# Patient Record
Sex: Female | Born: 1960 | Race: White | Hispanic: No | Marital: Married | Smoking: Former smoker
Health system: Southern US, Community
[De-identification: ages and names within clinical notes are randomized; demographics above are authoritative.]

## PROBLEM LIST (undated history)

## (undated) DIAGNOSIS — F32A Depression, unspecified: Secondary | ICD-10-CM

## (undated) DIAGNOSIS — G4700 Insomnia, unspecified: Secondary | ICD-10-CM

## (undated) DIAGNOSIS — K219 Gastro-esophageal reflux disease without esophagitis: Secondary | ICD-10-CM

## (undated) DIAGNOSIS — E079 Disorder of thyroid, unspecified: Secondary | ICD-10-CM

## (undated) DIAGNOSIS — E785 Hyperlipidemia, unspecified: Secondary | ICD-10-CM

## (undated) DIAGNOSIS — F329 Major depressive disorder, single episode, unspecified: Secondary | ICD-10-CM

## (undated) HISTORY — DX: Insomnia, unspecified: G47.00

## (undated) HISTORY — DX: Hyperlipidemia, unspecified: E78.5

## (undated) HISTORY — PX: THYROIDECTOMY, PARTIAL: SHX18

## (undated) HISTORY — DX: Gastro-esophageal reflux disease without esophagitis: K21.9

## (undated) HISTORY — PX: TUBAL LIGATION: SHX77

## (undated) HISTORY — PX: CARPAL TUNNEL RELEASE: SHX101

## (undated) HISTORY — PX: BLADDER REPAIR: SHX76

## (undated) HISTORY — PX: BREAST ENHANCEMENT SURGERY: SHX7

---

## 2002-08-27 ENCOUNTER — Encounter: Payer: Self-pay | Admitting: Internal Medicine

## 2002-08-27 ENCOUNTER — Ambulatory Visit (HOSPITAL_COMMUNITY): Admission: RE | Admit: 2002-08-27 | Discharge: 2002-08-27 | Payer: Self-pay | Admitting: Internal Medicine

## 2007-09-18 ENCOUNTER — Emergency Department (HOSPITAL_COMMUNITY): Admission: EM | Admit: 2007-09-18 | Discharge: 2007-09-18 | Payer: Self-pay | Admitting: Emergency Medicine

## 2007-10-01 ENCOUNTER — Ambulatory Visit (HOSPITAL_COMMUNITY): Admission: RE | Admit: 2007-10-01 | Discharge: 2007-10-01 | Payer: Self-pay | Admitting: Internal Medicine

## 2009-03-16 ENCOUNTER — Ambulatory Visit (HOSPITAL_COMMUNITY): Admission: RE | Admit: 2009-03-16 | Discharge: 2009-03-16 | Payer: Self-pay | Admitting: Internal Medicine

## 2010-07-18 ENCOUNTER — Encounter: Payer: Self-pay | Admitting: Internal Medicine

## 2010-07-21 ENCOUNTER — Encounter
Admission: RE | Admit: 2010-07-21 | Discharge: 2010-07-21 | Payer: Self-pay | Source: Home / Self Care | Attending: Obstetrics and Gynecology | Admitting: Obstetrics and Gynecology

## 2010-07-28 ENCOUNTER — Encounter: Payer: Self-pay | Admitting: Obstetrics and Gynecology

## 2010-10-12 ENCOUNTER — Ambulatory Visit (HOSPITAL_BASED_OUTPATIENT_CLINIC_OR_DEPARTMENT_OTHER)
Admission: RE | Admit: 2010-10-12 | Discharge: 2010-10-12 | Disposition: A | Discharge status: Home / Self Care | Payer: BC Managed Care – HMO | Source: Ambulatory Visit | Attending: Urology | Admitting: Urology

## 2010-10-12 DIAGNOSIS — Z01812 Encounter for preprocedural laboratory examination: Secondary | ICD-10-CM | POA: Insufficient documentation

## 2010-10-12 DIAGNOSIS — Z79899 Other long term (current) drug therapy: Secondary | ICD-10-CM | POA: Insufficient documentation

## 2010-10-12 DIAGNOSIS — N8111 Cystocele, midline: Secondary | ICD-10-CM | POA: Insufficient documentation

## 2010-10-12 DIAGNOSIS — N393 Stress incontinence (female) (male): Secondary | ICD-10-CM | POA: Insufficient documentation

## 2010-10-12 DIAGNOSIS — Z01810 Encounter for preprocedural cardiovascular examination: Secondary | ICD-10-CM | POA: Insufficient documentation

## 2010-10-12 LAB — TYPE AND SCREEN

## 2010-11-08 NOTE — Op Note (Signed)
  NAME:  Lindsay Howe, Lindsay Howe               ACCOUNT NO.:  [id number]  MEDICAL RECORD NO.:  [id number]           PATIENT TYPE:  LOCATION:                                 FACILITY:  PHYSICIAN:  Martina Sinner, MD      DATE OF BIRTH:  DATE OF PROCEDURE: DATE OF DISCHARGE:                              OPERATIVE REPORT   SURGEON:  Martina Sinner, MD  PREOPERATIVE DIAGNOSIS:  Stress incontinence.  POSTOPERATIVE DIAGNOSIS:  Stress incontinence.  SURGERY:  Sling, cystourethropexy [facility]) plus cystoscopy.  HISTORY:  The patient has stress incontinence.  Preoperative laboratory tests were normal.  Preoperative antibiotics were given.  Extra care was taken with leg positioning to minimize the risk of neuropathy and DVT.  Curved cerebellar and weighted vaginal speculum was utilized for exposure.  Urethral catheter was inserted.  Two 1 cm incisions were made 1 fingerbreadth above the symphysis pubis, 1.5 cm lateral to the midline. She had a fairly long urethra.  She had a grade 1 cystocele.  I instilled approximately 5 cc of lidocaine and epinephrine mixture suburethrally.  I made an appropriate deep incision in the midurethra and sharply dissected through the urethrovesical angle bilaterally.  With the bladder emptied, I passed SPARC needle on top along the back of symphysis pubis __________ bilaterally, staying lateral and turning medially.  I then cystoscoped the patient.  There is no injury to bladder or urethra.  There is no movement of bladder with moving out of the trocars.  There was excellent __________ bilaterally.  The bladder was emptied.  I tested SPARC sling and brought up to the retropubic space.  I tensioned over the fat part of moderate sized Kelly clamp.  I tensioned over the fat with moderate sized Kelly clamp.  I had a couple of the blue dots, irrigated the sheath and then removed the sheath.  I was very happy with the tension and position of the sling. There is  appropriate hypermobility with no sling back effect.  The sling was in the midurethra, and based upon her anatomy, I was a little bit in the proximal urethra.  The sling was away from any cystocele.  All incisions were irrigated.  I closed the anterior vaginal wall with running 2-0 followed by 2 interrupted sutures.  I cut the sling, brought all the skin, and closed the abdominal incisions with 4-0 Vicryl followed by Dermabond.  Foley catheter was clamped.  Vaginal pack was inserted.  Leg position was good.  Foley had clear blue urine at the end of the case. Hopefully, this operation will reach Ms. Gretzinger reach goal.          ______________________________ Martina Sinner, MD     SAM/MEDQ  D:  10/12/2010  T:  10/12/2010  Job:  [id number]  Electronically Signed by Alfredo Martinez MD on 11/08/2010 01:15:56 PM

## 2011-03-21 LAB — BASIC METABOLIC PANEL
Calcium: 9.1
GFR calc Af Amer: >60
GFR calc non Af Amer: >60
Glucose, Bld: 107 — ABNORMAL HIGH
Sodium: 133 — ABNORMAL LOW

## 2011-03-21 LAB — URINALYSIS, ROUTINE W REFLEX MICROSCOPIC
Glucose, UA: NEGATIVE
Hgb urine dipstick: NEGATIVE
Specific Gravity, Urine: 1.025
pH: 6

## 2011-03-21 LAB — DIFFERENTIAL
Basophils Absolute: 0
Lymphocytes Relative: 11 — ABNORMAL LOW
Monocytes Absolute: 0.5
Neutro Abs: 5.2

## 2011-03-21 LAB — CBC
Hemoglobin: 15
RDW: 13.2

## 2011-10-28 ENCOUNTER — Ambulatory Visit (HOSPITAL_COMMUNITY)
Admission: RE | Admit: 2011-10-28 | Discharge: 2011-10-28 | Disposition: A | Discharge status: Home / Self Care | Payer: Managed Care, Other (non HMO) | Source: Ambulatory Visit | Attending: Orthopedic Surgery | Admitting: Orthopedic Surgery

## 2011-10-28 DIAGNOSIS — M25559 Pain in unspecified hip: Secondary | ICD-10-CM | POA: Insufficient documentation

## 2011-10-28 DIAGNOSIS — M6281 Muscle weakness (generalized): Secondary | ICD-10-CM | POA: Insufficient documentation

## 2011-10-28 DIAGNOSIS — IMO0001 Reserved for inherently not codable concepts without codable children: Secondary | ICD-10-CM | POA: Insufficient documentation

## 2011-10-28 NOTE — Evaluation (Signed)
Physical Therapy Evaluation  Patient Details  Name: Lindsay Howe MRN: [id number] Date of Birth: 22-Oct-1960  Today's Date: 10/28/2011 Time: 0900-0947 PT Time Calculation (min): 47 min  Visit#: 1  of 6   Re-eval: 11/11/11 Assessment Diagnosis: B Greater trochanteric bursitis Next MD Visit:  (12/13/2011) Prior Therapy: none   Past Medical History: No past medical history on file. Past Surgical History: No past surgical history on file.  Subjective Symptoms/Limitations Symptoms: Ms. Lindsay Howe has had hip pain B whenever she is standing for prolong period of time.  She is able to be on her feet for about two hours before she has pain in her hip that runs down the outer thigh to the knee level.  Occasionally it will go down to her ankles.  She has had B injections about two weeks ago that still seem to be helping.  She was taking ibuprofen but was taking so much that her stomach was bothering her.  She states her pain is mainly at night.   How long can you sit comfortably?: No problem How long can you stand comfortably?: Standing is worse than walking.  She is able to stand for thirty minutes and she can start feeling increased discomfort.  She will need to stop standing after two hours. How long can you walk comfortably?: The patient states she can be on her feet for two hours before she has increased pain. Special Tests: The patient states that evenings are the worst.  She takes medicine to get to sleep.  Prior to the injection she was waking up up to 8 hours a night. Pain Assessment Currently in Pain?: Yes Pain Score:   4 (prior to injection pain was as high as an 8/10) Pain Location: Hip Pain Orientation: Right;Left Pain Type: Chronic pain Pain Onset: More than a month ago Pain Frequency: Constant Pain Relieving Factors: injection, ibuprofen Effect of Pain on Daily Activities: increases  Cognition Overall Cognitive Status: Appears within functional limits for tasks assessed     Assessment RLE Strength Right Hip Flexion: 5/5 Right Hip Extension: 5/5 Right Hip ABduction: 5/5 Right Hip ADduction: 5/5 Right Knee Flexion: 5/5 Right Knee Extension: 5/5 Right Ankle Dorsiflexion: 5/5 LLE Strength Left Hip Flexion: 5/5 Left Hip Extension: 5/5 Left Hip ABduction: 5/5 Left Hip ADduction: 5/5 Left Knee Flexion: 5/5 Left Knee Extension: 5/5 Left Ankle Dorsiflexion: 5/5  Exercise/Treatments  Modalities Modalities: Iontophoresis Iontophoresis Type of Iontophoresis: Dexamethasone Location: B greater trochanter Dose: 1.5 cc @ 60 ma Time: 15.00  Physical Therapy Assessment and Plan PT Assessment and Plan Clinical Impression Statement: Pt with B greater trochaneric bursitis who will benefit from iontophoresis to decrease her sx of pain and improve her functional ability. Pt will benefit from skilled therapeutic intervention in order to improve on the following deficits: Pain Rehab Potential: Good PT Frequency: Min 3X/week PT Duration:  (2 weeks.) PT Treatment/Interventions:  (iontophoresis; manual therapy(myofascial release),) PT Plan: See pt three times a week for two weeks for modalities including but not limited to iontophoresis and myofascial stretching of B hiip.    Goals Home Exercise Program Pt will Perform Home Exercise Program: Independently PT Short Term Goals Time to Complete Short Term Goals: 2 weeks PT Short Term Goal 1: Pain decreased by 50 %  PT Short Term Goal 3: Pt to be able to be on her feet for four hours without having increased pain  Problem List There is no problem list on file for this patient.   PT - End  of Session Activity Tolerance: Patient tolerated treatment well General Behavior During Session: [facility] for tasks performed Cognition: [facility] for tasks performed PT Plan of Care PT Home Exercise Plan: ITBand stretch Consulted and Agree with Plan of Care: Patient    Lindsay Howe,Lindsay Howe 10/28/2011, 5:24 PM  Physician  Documentation Your signature is required to indicate approval of the treatment plan as stated above.  Please sign and either send electronically or make a copy of this report for your files and return this physician signed original.   Please mark one 1.__approve of plan  2. ___approve of plan with the following conditions.   ______________________________                                                          _____________________ Physician Signature                                                                                                             Date

## 2011-11-01 ENCOUNTER — Ambulatory Visit (HOSPITAL_COMMUNITY)
Admission: RE | Admit: 2011-11-01 | Discharge: 2011-11-01 | Disposition: A | Discharge status: Home / Self Care | Payer: Managed Care, Other (non HMO) | Source: Ambulatory Visit | Attending: *Deleted | Admitting: *Deleted

## 2011-11-01 NOTE — Progress Notes (Signed)
Physical Therapy Treatment Patient Details  Name: Lindsay Howe MRN: [id number] Date of Birth: Dec 01, 1960  Today's Date: 11/01/2011 Time: 4098-1191 PT Time Calculation (min): 49 min Visit#: 2  of 6   Re-eval: 11/11/11 Charges: Sherrin Daisy x 15' Manual x 20'   Subjective: Symptoms/Limitations Symptoms: Pt is currently pain free. Pain Assessment Currently in Pain?: No/denies Pain Score: 0-No pain   Exercise/Treatments Stretches IT Band Stretch: 1 rep;30 seconds (B standing reviewed for proper technique)  Modalities Modalities: Iontophoresis Manual Therapy Manual Therapy: Myofascial release Myofascial Release: To B hip and ITB x 20' Iontophoresis Type of Iontophoresis: Dexamethasone Location: B greater trochanter Dose: 2 cc @ 60 ma Time: 15'  Physical Therapy Assessment and Plan PT Assessment and Plan Clinical Impression Statement: ITB tightness noted with MFR. Iontophoresis completed again this session to decrease pain. Pt reports decreased tightness at end of session. ITB stretch reviewed for HEP.  PT Plan: Continue per PT POC.     Problem List There is no problem list on file for this patient.   PT - End of Session Activity Tolerance: Patient tolerated treatment well General Behavior During Session: [facility] for tasks performed Cognition: [facility] for tasks performed  Seth Bake, PTA 11/01/2011, 1:58 PM

## 2011-11-03 ENCOUNTER — Ambulatory Visit (HOSPITAL_COMMUNITY)
Admission: RE | Admit: 2011-11-03 | Discharge: 2011-11-03 | Disposition: A | Discharge status: Home / Self Care | Payer: Managed Care, Other (non HMO) | Source: Ambulatory Visit | Attending: Orthopedic Surgery | Admitting: Orthopedic Surgery

## 2011-11-03 NOTE — Progress Notes (Signed)
Physical Therapy Treatment Patient Details  Name: Lindsay Howe MRN: [id number] Date of Birth: 1960/10/02  Today's Date: 11/03/2011 Time: 1478-2956 PT Time Calculation (min): 61 min Visit#: 3  of 6   Re-eval: 11/11/11 Charges: Manual x 20 Ionto x 22'  Symptoms/Limitations Symptoms: I think the massage really helped. Pain Assessment Currently in Pain?: No/denies Pain Score: 0-No pain   Exercise/Treatments Modalities Modalities: Iontophoresis Manual Therapy Manual Therapy: Myofascial release Myofascial Release: To B hip and ITB x 20' Iontophoresis Type of Iontophoresis: Dexamethasone Location: B greater trochanter Dose: 2 cc @ 60 ma Time: 22' (Pt unable to tolerate greater than 2.5 current this session)  Physical Therapy Assessment and Plan PT Assessment and Plan Clinical Impression Statement: Pt presents with decreased sensitivity to manual therapy this session. Pt also presents with decreased spasms and adhesions. Pt states "They feel better" at end of session. PT Plan: Continue per PT POC. Begin figure 4 piriformis stretch next session.     Problem List There is no problem list on file for this patient.   PT - End of Session Activity Tolerance: Patient tolerated treatment well General Behavior During Session: [facility] for tasks performed Cognition: [facility] for tasks performed   Seth Bake, PTA 11/03/2011, 2:31 PM

## 2011-11-09 ENCOUNTER — Ambulatory Visit (HOSPITAL_COMMUNITY)
Admission: RE | Admit: 2011-11-09 | Discharge: 2011-11-09 | Disposition: A | Discharge status: Home / Self Care | Payer: Managed Care, Other (non HMO) | Source: Ambulatory Visit | Attending: Orthopedic Surgery | Admitting: Orthopedic Surgery

## 2011-11-09 NOTE — Progress Notes (Signed)
Physical Therapy Treatment Patient Details  Name: Lindsay Howe MRN: [id number] Date of Birth: 04-23-1961  Today's Date: 11/09/2011 Time:  1345- 1438   Visit#: 4  of 6   Re-eval: 11/14/11   Subjective: Symptoms/Limitations Symptoms: Pt states she had no more R hip pain after her last visit but she is having L hip pain now.l Pain Assessment Pain Score:   4 Pain Location: Hip Pain Orientation: Left Pain Type: Chronic pain Pain Onset: More than a month ago Pain Frequency: Constant    Exercise/Treatments Pt doing stretches at home  Modalities Modalities: Iontophoresis Manual Therapy Manual Therapy: Myofascial release Iontophoresis Type of Iontophoresis: Dexamethasone Location: B greater trochaner  Dose: 1.5 cc @ 60 ma current at 2.8 used gel under ground pad.  Physical Therapy Assessment and Plan    Improvement with R hip.  Will continue to give 6 ionto treatments then assess pain   Goals    Problem List There is no problem list on file for this patient.    GP No functional reporting required  Lindsay Howe,Lindsay Howe 11/09/2011, 4:40 PM

## 2011-11-14 ENCOUNTER — Ambulatory Visit (HOSPITAL_COMMUNITY): Payer: Managed Care, Other (non HMO) | Admitting: Physical Therapy

## 2011-11-16 ENCOUNTER — Ambulatory Visit (HOSPITAL_COMMUNITY)
Admission: RE | Admit: 2011-11-16 | Discharge: 2011-11-16 | Disposition: A | Discharge status: Home / Self Care | Payer: Managed Care, Other (non HMO) | Source: Ambulatory Visit | Attending: Orthopedic Surgery | Admitting: Orthopedic Surgery

## 2011-11-16 NOTE — Progress Notes (Signed)
Physical Therapy Treatment Patient Details  Name: Lindsay Howe MRN: [id number] Date of Birth: Mar 22, 1961  Today's Date: 11/16/2011 Time: 0940-1005 PT Time Calculation (min): 25 min Visit#: 5  of 6   Re-eval:  (Next visit) Charges: Ionto x 15'   Subjective: Symptoms/Limitations Symptoms: Pt is pain free. Pain Assessment Currently in Pain?: No/denies   Exercise/Treatments  Iontophoresis Location: B greater trochaner Dose: 2 cc@ 60ma  Time: 15'  Physical Therapy Assessment and Plan PT Assessment and Plan Clinical Impression Statement: Mnual therapy held this session as pt is pain free. Fith ionto completed this session. Pt is without complaint throughout session.  PT Plan: Reassess next session.     Problem List There is no problem list on file for this patient.   PT - End of Session Activity Tolerance: Patient tolerated treatment well General Behavior During Session: [facility] for tasks performed Cognition: [facility] for tasks performed  Seth Bake, PTA 11/16/2011, 10:15 AM

## 2011-11-17 ENCOUNTER — Ambulatory Visit (HOSPITAL_COMMUNITY)
Admission: RE | Admit: 2011-11-17 | Discharge: 2011-11-17 | Disposition: A | Discharge status: Home / Self Care | Payer: Managed Care, Other (non HMO) | Source: Ambulatory Visit | Attending: Orthopedic Surgery | Admitting: Orthopedic Surgery

## 2011-11-17 NOTE — Progress Notes (Signed)
Physical Therapy Treatment Patient Details  Name: Lindsay Howe MRN: [id number] Date of Birth: February 20, 1961  Today's Date: 11/17/2011 Time: 4098-1191 PT Time Calculation (min): 34 min Visit#: 6  of 6   Charges: Ionto x 15'  Subjective: Symptoms/Limitations Symptoms: I'm not having any pain. I think I'm ready to be finished with therapy. It has really helped. Pain Assessment Currently in Pain?: No/denies Pain Score: 0-No pain  LEFS 74.5/80 (60/80 at eval)  Iontophoresis Type of Iontophoresis: Dexamethasone Location: B greater trochaner Dose: 2 cc@ 60ma Time: 15'  Physical Therapy Assessment and Plan PT Assessment and Plan Clinical Impression Statement: Pt is pain free and without complaint. All goals have been met. Pt's LEFS score has increased 14.5 points from initial eval. PT Plan: Recommend D/C to HEP to PT.    Goals PT Short Term Goals PT Short Term Goal 1: Pain decreased by 50 % PT Short Term Goal 1 - Progress: Met PT Short Term Goal 3: Pt to be able to be on her feet for four hours without having increased pain PT Short Term Goal 3 - Progress: Met  Problem List There is no problem list on file for this patient.   PT - End of Session Activity Tolerance: Patient tolerated treatment well General Behavior During Session: [facility] for tasks performed Cognition: [facility] for tasks performed   Seth Bake, PTA 11/17/2011, 1:42 PM

## 2011-11-18 ENCOUNTER — Inpatient Hospital Stay (HOSPITAL_COMMUNITY)
Admission: RE | Admit: 2011-11-18 | Payer: Managed Care, Other (non HMO) | Source: Ambulatory Visit | Admitting: Physical Therapy

## 2011-11-22 ENCOUNTER — Ambulatory Visit (HOSPITAL_COMMUNITY): Payer: Managed Care, Other (non HMO) | Admitting: *Deleted

## 2011-11-24 ENCOUNTER — Ambulatory Visit (HOSPITAL_COMMUNITY): Payer: Managed Care, Other (non HMO) | Admitting: Physical Therapy

## 2011-11-25 ENCOUNTER — Ambulatory Visit (HOSPITAL_COMMUNITY): Payer: Managed Care, Other (non HMO)

## 2011-11-28 ENCOUNTER — Ambulatory Visit (HOSPITAL_COMMUNITY): Payer: Managed Care, Other (non HMO) | Admitting: Physical Therapy

## 2011-11-30 ENCOUNTER — Ambulatory Visit (HOSPITAL_COMMUNITY): Payer: Managed Care, Other (non HMO) | Admitting: *Deleted

## 2011-12-02 ENCOUNTER — Ambulatory Visit (HOSPITAL_COMMUNITY): Payer: Managed Care, Other (non HMO)

## 2011-12-05 ENCOUNTER — Ambulatory Visit (HOSPITAL_COMMUNITY): Payer: Managed Care, Other (non HMO)

## 2011-12-07 ENCOUNTER — Ambulatory Visit (HOSPITAL_COMMUNITY): Payer: Managed Care, Other (non HMO) | Admitting: *Deleted

## 2011-12-09 ENCOUNTER — Ambulatory Visit (HOSPITAL_COMMUNITY): Payer: Managed Care, Other (non HMO) | Admitting: *Deleted

## 2011-12-12 ENCOUNTER — Ambulatory Visit (HOSPITAL_COMMUNITY): Payer: Managed Care, Other (non HMO)

## 2011-12-14 ENCOUNTER — Ambulatory Visit (HOSPITAL_COMMUNITY): Payer: Managed Care, Other (non HMO) | Admitting: Physical Therapy

## 2011-12-16 ENCOUNTER — Ambulatory Visit (HOSPITAL_COMMUNITY): Payer: Managed Care, Other (non HMO) | Admitting: Physical Therapy

## 2011-12-19 ENCOUNTER — Ambulatory Visit (HOSPITAL_COMMUNITY): Payer: Managed Care, Other (non HMO) | Admitting: Physical Therapy

## 2011-12-21 ENCOUNTER — Ambulatory Visit (HOSPITAL_COMMUNITY): Payer: Managed Care, Other (non HMO) | Admitting: *Deleted

## 2011-12-23 ENCOUNTER — Ambulatory Visit (HOSPITAL_COMMUNITY): Payer: Managed Care, Other (non HMO) | Admitting: Physical Therapy

## 2012-04-14 ENCOUNTER — Emergency Department (HOSPITAL_COMMUNITY)
Admission: EM | Admit: 2012-04-14 | Discharge: 2012-04-14 | Disposition: A | Discharge status: Home / Self Care | Payer: Managed Care, Other (non HMO) | Attending: Emergency Medicine | Admitting: Emergency Medicine

## 2012-04-14 ENCOUNTER — Encounter (HOSPITAL_COMMUNITY): Payer: Self-pay | Admitting: Emergency Medicine

## 2012-04-14 ENCOUNTER — Emergency Department (HOSPITAL_COMMUNITY): Payer: Managed Care, Other (non HMO)

## 2012-04-14 DIAGNOSIS — F3289 Other specified depressive episodes: Secondary | ICD-10-CM | POA: Insufficient documentation

## 2012-04-14 DIAGNOSIS — R0789 Other chest pain: Secondary | ICD-10-CM

## 2012-04-14 DIAGNOSIS — F329 Major depressive disorder, single episode, unspecified: Secondary | ICD-10-CM | POA: Insufficient documentation

## 2012-04-14 DIAGNOSIS — E079 Disorder of thyroid, unspecified: Secondary | ICD-10-CM | POA: Insufficient documentation

## 2012-04-14 HISTORY — DX: Depression, unspecified: F32.A

## 2012-04-14 HISTORY — DX: Disorder of thyroid, unspecified: E07.9

## 2012-04-14 HISTORY — DX: Major depressive disorder, single episode, unspecified: F32.9

## 2012-04-14 LAB — COMPREHENSIVE METABOLIC PANEL
ALT: 25 U/L (ref 0–35)
AST: 19 U/L (ref 0–37)
Albumin: 3.6 g/dL (ref 3.5–5.2)
Alkaline Phosphatase: 95 U/L (ref 39–117)
BUN: 13 mg/dL (ref 6–23)
CO2: 26 mEq/L (ref 19–32)
Calcium: 9.6 mg/dL (ref 8.4–10.5)
Chloride: 101 mEq/L (ref 96–112)
Creatinine, Ser: 0.62 mg/dL (ref 0.50–1.10)
GFR calc Af Amer: >90 mL/min (ref >90)
GFR calc non Af Amer: >90 mL/min (ref >90)
Glucose, Bld: 82 mg/dL (ref 70–99)
Potassium: 3.6 mEq/L (ref 3.5–5.1)
Sodium: 136 mEq/L (ref 135–145)
Total Bilirubin: 0.3 mg/dL (ref 0.3–1.2)
Total Protein: 7.3 g/dL (ref 6.0–8.3)

## 2012-04-14 LAB — CBC
HCT: 42.2 % (ref 36.0–46.0)
Hemoglobin: 14.7 g/dL (ref 12.0–15.0)
MCH: 32 pg (ref 26.0–34.0)
MCHC: 34.8 g/dL (ref 30.0–36.0)
MCV: 91.7 fL (ref 78.0–100.0)
Platelets: 259 10*3/uL (ref 150–400)
RBC: 4.6 MIL/uL (ref 3.87–5.11)
RDW: 13.1 % (ref 11.5–15.5)
WBC: 5.9 10*3/uL (ref 4.0–10.5)

## 2012-04-14 LAB — TROPONIN I: Troponin I: <0.3 ng/mL (ref <0.30)

## 2012-04-14 LAB — D-DIMER, QUANTITATIVE: D-Dimer, Quant: <0.27 ug/mL-FEU (ref 0.00–0.48)

## 2012-04-14 NOTE — ED Provider Notes (Signed)
History   This chart was scribed for Glynn Octave, MD by Charolett Bumpers . The patient was seen in room APA17/APA17. Patient's care was started at 1153.   CSN: [id number] Arrival date & time 04/14/12  1138  First MD Initiated Contact with Patient 04/14/12 1153      Chief Complaint  Patient presents with  . Chest Pain   The history is provided by the patient. No language interpreter was used.   Lindsay Howe is a 51 y.o. female who presents to the Emergency Department complaining of intermittent chest pain under left breast for 2 weeks lasting a few seconds at time. She states the chest pain has been constant since waking up today. She states her symptoms are not aggravated with deep breaths or palpation. She denies any modifying factors. She denies any SOB, cough, diaphoresis, n/v, abdominal pain. Denies any o skin changes or nipple discharge. She reports a h/o hypothyroid and depression. She denies any h/o HTN, DM. She states she is a former smoker of 15 years. Denies any family cardiac hx. She states she has never had a stress test.  Dr. Clelia Croft in ED  Past Medical History  Diagnosis Date  . Thyroid disease   . Depression     Past Surgical History  Procedure Date  . Thyroidectomy, partial   . Tubal ligation   . Breast enhancement surgery   . Carpal tunnel release     left    History reviewed. No pertinent family history.  History  Substance Use Topics  . Smoking status: Former Smoker -- 1.0 packs/day for 15 years    Types: Cigarettes    Quit date: 03/27/2009  . Smokeless tobacco: Never Used  . Alcohol Use: 1.8 oz/week    3 Glasses of wine per week    OB History    Grav Para Term Preterm Abortions TAB SAB Ect Mult Living   2 2 2       2       Review of Systems A complete 10 system review of systems was obtained and all systems are negative except as noted in the HPI and PMH.   Allergies  Review of patient's allergies indicates no known  allergies.  Home Medications   Current Outpatient Rx  Name Route Sig Dispense Refill  . BIOTIN PO Oral Take 1 tablet by mouth daily.    Marland Kitchen ESCITALOPRAM OXALATE 10 MG PO TABS Oral Take 10 mg by mouth daily.    Marland Kitchen LEVOTHYROXINE SODIUM 125 MCG PO TABS Oral Take 125 mcg by mouth daily.      BP 120/65  Pulse 73  Temp 97.9 F (36.6 C) (Oral)  Resp 20  Ht 5\' 2"  (1.575 m)  Wt 174 lb (78.926 kg)  BMI 31.83 kg/m2  SpO2 96%  LMP 02/01/2012  Physical Exam  Nursing note and vitals reviewed. Constitutional: She is oriented to person, place, and time. She appears well-developed and well-nourished. No distress.  HENT:  Head: Normocephalic and atraumatic.  Eyes: EOM are normal. Pupils are equal, round, and reactive to light.  Neck: Normal range of motion. Neck supple. No tracheal deviation present.  Cardiovascular: Normal rate, regular rhythm and normal heart sounds.   No murmur heard. Pulmonary/Chest: Effort normal and breath sounds normal. No respiratory distress. She has no wheezes. She exhibits no tenderness.       Examined the breast with chaperon present.   Abdominal: Soft. Bowel sounds are normal. She exhibits no distension. There is no  tenderness.  Musculoskeletal: Normal range of motion. She exhibits no edema.  Neurological: She is alert and oriented to person, place, and time.  Skin: Skin is warm and dry. No rash noted.  Psychiatric: She has a normal mood and affect. Her behavior is normal.    ED Course  Procedures (including critical care time)  DIAGNOSTIC STUDIES: Oxygen Saturation is 94% on room air, normal by my interpretation.    COORDINATION OF CARE:  12:13-Discussed planned course of treatment with the patient including a chest x-ray, blood work, who is agreeable at this time.     Labs Reviewed  CBC  COMPREHENSIVE METABOLIC PANEL  TROPONIN I  D-DIMER, QUANTITATIVE  TROPONIN I   Dg Chest 2 View  04/14/2012  *RADIOLOGY REPORT*  Clinical Data: Chest pain  CHEST  - 2 VIEW  Comparison: 03/16/2009  Findings: The heart size and mediastinal contours are within normal limits.  Both lungs are clear.  The visualized skeletal structures are unremarkable.  IMPRESSION: No active cardiopulmonary abnormalities.   Original Report Authenticated By: Rosealee Albee, M.D.      1. Atypical chest pain       MDM  Intermittent left-sided chest pain for the past 2 weeks it occurs daily lasting seconds to minutes at a time period lasting for hours today and constant. No shortness of breath, nausea, vomiting, diaphoresis or dizziness. No cardiac history. Former smoker.   EKG nonischemic.  Troponin and D-dimer negative.  Patient has had constant pain for 5 hours.  Low suspicion for ACS. Stable for stress test as outpatient.   Date: 04/14/2012  Rate: 70  Rhythm: normal sinus rhythm  QRS Axis: normal  Intervals: normal  ST/T Wave abnormalities: normal  Conduction Disutrbances:none  Narrative Interpretation:   Old EKG Reviewed: none available    I personally performed the services described in this documentation, which was scribed in my presence.  The recorded information has been reviewed and considered.      Glynn Octave, MD 04/14/12 [phone]

## 2012-04-14 NOTE — ED Notes (Signed)
Patient c/o left sided chest pain that radiates to back x2 weeks. Per patient-thought it was indigestion but it has progressively gotten worse and more frequent. Reported one episode of shortness of breath but denies any now. Denies any nausea, vomiting, weakness, or dizziness.

## 2012-06-06 ENCOUNTER — Ambulatory Visit (INDEPENDENT_AMBULATORY_CARE_PROVIDER_SITE_OTHER): Payer: Managed Care, Other (non HMO) | Admitting: Internal Medicine

## 2012-06-06 ENCOUNTER — Other Ambulatory Visit (INDEPENDENT_AMBULATORY_CARE_PROVIDER_SITE_OTHER): Payer: Self-pay | Admitting: *Deleted

## 2012-06-06 ENCOUNTER — Encounter (INDEPENDENT_AMBULATORY_CARE_PROVIDER_SITE_OTHER): Payer: Self-pay | Admitting: Internal Medicine

## 2012-06-06 ENCOUNTER — Telehealth (INDEPENDENT_AMBULATORY_CARE_PROVIDER_SITE_OTHER): Payer: Self-pay | Admitting: *Deleted

## 2012-06-06 VITALS — BP 120/70 | HR 72 | Temp 97.9°F | Ht 62.0 in | Wt 185.1 lb

## 2012-06-06 DIAGNOSIS — R1310 Dysphagia, unspecified: Secondary | ICD-10-CM

## 2012-06-06 DIAGNOSIS — Z1211 Encounter for screening for malignant neoplasm of colon: Secondary | ICD-10-CM

## 2012-06-06 DIAGNOSIS — K219 Gastro-esophageal reflux disease without esophagitis: Secondary | ICD-10-CM | POA: Insufficient documentation

## 2012-06-06 DIAGNOSIS — E039 Hypothyroidism, unspecified: Secondary | ICD-10-CM | POA: Insufficient documentation

## 2012-06-06 MED ORDER — PEG-KCL-NACL-NASULF-NA ASC-C 100 G PO SOLR: 1.0000 | Freq: Once | ORAL | Status: DC

## 2012-06-06 NOTE — Telephone Encounter (Signed)
Patient needs movi prep 

## 2012-06-06 NOTE — Patient Instructions (Addendum)
EGD/Colonoscopy

## 2012-06-06 NOTE — Progress Notes (Signed)
Subjective:     Patient ID: Lindsay Howe, female   DOB: [birth year], 51 y.o.   MRN: [id number]  HPI Referred to our office by Dr. Sherryll Burger for screening colonoscopy and an EGD. She also c/o dysphagia.  The Prilosec has helped.She was having chest pain.  No burping.  Foods felt like they were lodging. Symptoms x 4 months. Started the Prilosec one month ago and symptoms improved. No foods in particular would lodge. She denies acid reflux. She would like for Dr Karilyn Cota to do an EGD. Appetite good.  No weight loss. No acid reflux. BMs x 1 a day or every 2 days. No melena or bright red rectal bleeding.  08/25/2011 ALP 71, AST 20, ALT 18, H and H 15.0 and 43.0, MCV 89, Platelet ct 308. Review of Systems see hpi Current Outpatient Prescriptions  Medication Sig Dispense Refill  . BIOTIN PO Take 1 tablet by mouth daily.      Marland Kitchen escitalopram (LEXAPRO) 10 MG tablet Take 10 mg by mouth daily.      Marland Kitchen levothyroxine (SYNTHROID, LEVOTHROID) 125 MCG tablet Take 125 mcg by mouth daily.      . Nutritional Supplements (ESTROVEN ENERGY PO) Take by mouth.      Marland Kitchen omeprazole (PRILOSEC) 20 MG capsule Take 20 mg by mouth daily.       Past Medical History  Diagnosis Date  . Thyroid disease   . Depression    Past Surgical History  Procedure Date  . Thyroidectomy, partial   . Tubal ligation   . Breast enhancement surgery   . Carpal tunnel release     left   No Known Allergies       Objective:   Physical Exam Filed Vitals:   06/06/12 1455  BP: 120/70  Pulse: 72  Temp: 97.9 F (36.6 C)  Height: 5\' 2"  (1.575 m)  Weight: 185 lb 1.6 oz (83.961 kg)  Alert and oriented. Skin warm and dry. Oral mucosa is moist.   . Sclera anicteric, conjunctivae is pink. Thyroid not enlarged. No cervical lymphadenopathy. Lungs clear. Heart regular rate and rhythm.  Abdomen is soft. Bowel sounds are positive. No hepatomegaly. No abdominal masses felt. No tenderness.  No edema to lower extremities.         Assessment:   solid food dysphagia better with the Prilosec. Patient however would like to continue with an EGD. Screening colonoscopy.    Plan:    EGD/Colonoscopy

## 2012-06-07 ENCOUNTER — Encounter (INDEPENDENT_AMBULATORY_CARE_PROVIDER_SITE_OTHER): Payer: Self-pay

## 2012-06-08 ENCOUNTER — Encounter (HOSPITAL_COMMUNITY): Payer: Self-pay | Admitting: Pharmacy Technician

## 2012-07-05 ENCOUNTER — Encounter (HOSPITAL_COMMUNITY)
Admission: RE | Disposition: A | Discharge status: Home / Self Care | Payer: Self-pay | Source: Ambulatory Visit | Attending: Internal Medicine

## 2012-07-05 ENCOUNTER — Ambulatory Visit (HOSPITAL_COMMUNITY)
Admission: RE | Admit: 2012-07-05 | Discharge: 2012-07-05 | Disposition: A | Discharge status: Home / Self Care | Payer: Managed Care, Other (non HMO) | Source: Ambulatory Visit | Attending: Internal Medicine | Admitting: Internal Medicine

## 2012-07-05 ENCOUNTER — Encounter (HOSPITAL_COMMUNITY): Payer: Self-pay | Admitting: *Deleted

## 2012-07-05 DIAGNOSIS — K208 Other esophagitis without bleeding: Secondary | ICD-10-CM | POA: Insufficient documentation

## 2012-07-05 DIAGNOSIS — R1310 Dysphagia, unspecified: Secondary | ICD-10-CM

## 2012-07-05 DIAGNOSIS — K449 Diaphragmatic hernia without obstruction or gangrene: Secondary | ICD-10-CM

## 2012-07-05 DIAGNOSIS — Z1211 Encounter for screening for malignant neoplasm of colon: Secondary | ICD-10-CM

## 2012-07-05 DIAGNOSIS — K2990 Gastroduodenitis, unspecified, without bleeding: Secondary | ICD-10-CM

## 2012-07-05 DIAGNOSIS — K2960 Other gastritis without bleeding: Secondary | ICD-10-CM | POA: Insufficient documentation

## 2012-07-05 HISTORY — PX: COLONOSCOPY WITH ESOPHAGOGASTRODUODENOSCOPY (EGD): SHX5779

## 2012-07-05 SURGERY — COLONOSCOPY WITH ESOPHAGOGASTRODUODENOSCOPY (EGD)
Anesthesia: Moderate Sedation

## 2012-07-05 MED ORDER — MIDAZOLAM HCL 5 MG/5ML IJ SOLN
INTRAMUSCULAR | Status: AC
  Filled 2012-07-05: qty 5

## 2012-07-05 MED ORDER — MIDAZOLAM HCL 5 MG/5ML IJ SOLN
INTRAMUSCULAR | Status: DC | PRN
  Administered 2012-07-05: 2 mg via INTRAVENOUS
  Administered 2012-07-05 (×2): 1 mg via INTRAVENOUS
  Administered 2012-07-05 (×4): 2 mg via INTRAVENOUS
  Administered 2012-07-05: 1 mg via INTRAVENOUS
  Administered 2012-07-05: 2 mg via INTRAVENOUS

## 2012-07-05 MED ORDER — MIDAZOLAM HCL 5 MG/5ML IJ SOLN
INTRAMUSCULAR | Status: AC
  Filled 2012-07-05: qty 10

## 2012-07-05 MED ORDER — MEPERIDINE HCL 50 MG/ML IJ SOLN
INTRAMUSCULAR | Status: DC | PRN
  Administered 2012-07-05 (×2): 25 mg via INTRAVENOUS

## 2012-07-05 MED ORDER — MEPERIDINE HCL 50 MG/ML IJ SOLN
INTRAMUSCULAR | Status: AC
  Filled 2012-07-05: qty 1

## 2012-07-05 MED ORDER — SODIUM CHLORIDE 0.45 % IV SOLN
INTRAVENOUS | Status: DC
Start: 2012-07-05 — End: 2012-07-05
  Administered 2012-07-05: 20 mL/h via INTRAVENOUS

## 2012-07-05 MED ORDER — BUTAMBEN-TETRACAINE-BENZOCAINE 2-2-14 % EX AERO
INHALATION_SPRAY | CUTANEOUS | Status: DC | PRN
  Administered 2012-07-05: 2 via TOPICAL

## 2012-07-05 MED ORDER — DEXLANSOPRAZOLE 60 MG PO CPDR: 60.0000 mg | DELAYED_RELEASE_CAPSULE | Freq: Every day | ORAL | Status: DC

## 2012-07-05 NOTE — H&P (Signed)
Lindsay Howe is an 52 y.o. female.   Chief Complaint: Patient is here for EGD, ED and colonoscopy. HPI: Patient is 52 year old Caucasian female who presents with 4 month history of intermittent solid food dysphagia. He also has history of heartburn. Since she has been on Prilosec she has heartburn every 3-4 days. She points to her mid sternal area side abortus obstruction. She has no difficulty with liquids. She has good appetite and her weight is stable. She is also undergoing average risk screening colonoscopy. Family history is significant only for colon carcinoma in maternal grandmother who is in the 66s at the time of diagnosis.  Past Medical History  Diagnosis Date  . Thyroid disease   . Depression     Past Surgical History  Procedure Date  . Thyroidectomy, partial   . Tubal ligation   . Breast enhancement surgery   . Carpal tunnel release     left    History reviewed. No pertinent family history. Social History:  reports that she quit smoking about 3 years ago. Her smoking use included Cigarettes. She has a 15 pack-year smoking history. She has never used smokeless tobacco. She reports that she drinks about 1.8 ounces of alcohol per week. She reports that she does not use illicit drugs.  Allergies: No Known Allergies  Medications Prior to Admission  Medication Sig Dispense Refill  . BIOTIN PO Take 1 tablet by mouth daily.      Marland Kitchen buPROPion (WELLBUTRIN) 100 MG tablet Take 150 mg by mouth once.      . escitalopram (LEXAPRO) 10 MG tablet Take 10 mg by mouth daily.      Marland Kitchen levothyroxine (SYNTHROID, LEVOTHROID) 125 MCG tablet Take 125 mcg by mouth daily.      . Nutritional Supplements (ESTROVEN ENERGY PO) Take 1 tablet by mouth daily.       Marland Kitchen omeprazole (PRILOSEC) 20 MG capsule Take 20 mg by mouth daily.        No results found for this or any previous visit (from the past 48 hour(s)). No results found.  ROS  Blood pressure 117/62, pulse 78, temperature 98.1 F (36.7 C),  temperature source Oral, SpO2 95.00%. Physical Exam  Constitutional: She appears well-developed and well-nourished.  HENT:  Mouth/Throat: Oropharynx is clear and moist.  Eyes: Conjunctivae normal are normal. No scleral icterus.  Neck: No thyromegaly present.  Cardiovascular: Normal rate, regular rhythm and normal heart sounds.   No murmur heard. Respiratory: Effort normal and breath sounds normal.  GI: Soft. She exhibits no distension and no mass.  Musculoskeletal: She exhibits no edema.  Lymphadenopathy:    She has no cervical adenopathy.  Neurological: She is alert.  Skin: Skin is warm and dry.     Assessment/Plan Solid food dysphagia in a patient with GERD. EGD, ED and average risk screening colonoscopy  Kinlee Garrison U 07/05/2012, 12:40 PM

## 2012-07-05 NOTE — Op Note (Signed)
EGD PROCEDURE REPORT  PATIENT:  Lindsay Howe  MR#:  [id number] Birthdate:  September 06, 1960, 52 y.o., female Endoscopist:  Dr. Malissa Hippo, MD Referred By:  Dr. Kirstie Peri, MD. Procedure Date: 07/05/2012  Procedure:   EGD, ED & Colonoscopy.  Indications:  Patient is 52 year old Caucasian female with chronic GERD who presents with intermittent solid food dysphagia. She is having 2-3 episodes of heartburn per week despite taking omeprazole and watching her diet. She is also undergoing average risk screening colonoscopy.            Informed Consent:  The risks, benefits, alternatives & imponderables which include, but are not limited to, bleeding, infection, perforation, drug reaction and potential missed lesion have been reviewed.  The potential for biopsy, lesion removal, esophageal dilation, etc. have also been discussed.  Questions have been answered.  All parties agreeable.  Please see history & physical in medical record for more information.  Medications:  Demerol 50 mg IV Versed 15 mg IV Cetacaine spray topically for oropharyngeal anesthesia  EGD  Description of procedure:  The endoscope was introduced through the mouth and advanced to the second portion of the duodenum without difficulty or limitations. The mucosal surfaces were surveyed very carefully during advancement of the scope and upon withdrawal.  Findings:  Esophagus:  Mucosa of the proximal and middle third was normal. Three erosions noted at distal esophagus close to GE junction. No ring or stricture noted. GEJ:  32 cm Hiatus:  35 cm Stomach:  Stomach was empty and distended very well with insufflation. Folds in the proximal stomach were normal. Examination of mucosa at body was normal. In the prepyloric region there was focal mucosal edema with a linear scar as well as patchy erythema and erosions. Pyloric channel was patent. Angularis fundus and cardia were examined by retroflexing the scope and were normal. Duodenum:   Few bulbar erosions are noted. Os bulbar mucosa was normal.  Therapeutic/Diagnostic Maneuvers Performed:  Esophagus dilated by passing 54 French Maloney dilator but no mucosal disruption noted.  COLONOSCOPY Description of procedure:  After a digital rectal exam was performed, that colonoscope was advanced from the anus through the rectum and colon to the area of the cecum, ileocecal valve and appendiceal orifice. The cecum was deeply intubated. These structures were well-seen and photographed for the record. From the level of the cecum and ileocecal valve, the scope was slowly and cautiously withdrawn. The mucosal surfaces were carefully surveyed utilizing scope tip to flexion to facilitate fold flattening as needed. The scope was pulled down into the rectum where a thorough exam including retroflexion was performed.  Findings:   Prep excellent. Normal mucosa of the colon and rectum. Normal anorectal junction.  Therapeutic/Diagnostic Maneuvers Performed:  None  Complications:  None  Cecal Withdrawal Time:  15 minutes  Impression:  Erosive reflux esophagitis. Small sliding hiatal hernia. Erosive antral gastritis with the scar. Bulbar erosions. Esophagus dilated by passing 54 French Maloney dilator but no mucosal disruption induced. Normal colonoscopy.  Recommendations:  Discontinue omeprazole. Start Dexlansoprazole 60 mg by mouth every morning. H. pylori serology.  REHMAN,NAJEEB U  07/05/2012 1:55 PM  CC: Dr. Kirstie Peri, MD & Dr. Bonnetta Barry ref. provider found

## 2012-07-10 ENCOUNTER — Encounter (HOSPITAL_COMMUNITY): Payer: Self-pay | Admitting: Internal Medicine

## 2012-10-11 NOTE — H&P (Signed)
[facility] SOAP Note  Vital Signs:  Vitals as of: 10/11/2012: Systolic 153: Diastolic 85: Heart Rate 84: Temp 97.9F: Height 52ft 1in: Weight 188Lbs 0 Ounces: BMI 35.52  BMI : 35.52 kg/m2  Subjective: This 52 Years 6 Months old Female presents for evaluation of a right ear laceration.  Recently sustained right ear lobe laceration.   Review of Symptoms:  Constitutional:unremarkable   Head:unremarkable    Eyes:unremarkable   Nose/Mouth/Throat:unremarkable Cardiovascular:  unremarkable   Respiratory:unremarkable   Gastrointestinal:  unremarkable   Genitourinary:unremarkable     Musculoskeletal:unremarkable   skin rashes in the past Hematolgic/Lymphatic:unremarkable     Allergic/Immunologic:unremarkable     Past Medical History:    Reviewed   Past Medical History  Surgical History: breast augmentation, partial thyroidectomy, BTL, carpal tunnel repair bilaterally, bladder tackup Medical Problems:  High cholesterol, Hypothyroidism Allergies: nkda Medications: levothyroxine, biotin, wellbutrin, ecitalopram, simvastatin   Social History:Reviewed  Social History  Preferred Language: English Race:  White Ethnicity: Not Hispanic / Latino Age: 52 Years 0 Months Marital Status:  S Alcohol: socially Recreational drug(s):  No   Smoking Status: Never smoker reviewed on 10/11/2012 Functional Status reviewed on mm/dd/yyyy ------------------------------------------------ Bathing: Normal Cooking: Normal Dressing: Normal Driving: Normal Eating: Normal Managing Meds: Normal Oral Care: Normal Shopping: Normal Toileting: Normal Transferring: Normal Walking: Normal Cognitive Status reviewed on mm/dd/yyyy ------------------------------------------------ Attention: Normal Decision Making: Normal Language: Normal Memory: Normal Motor: Normal Perception: Normal Problem Solving: Normal Visual and Spatial: Normal   Family History:   Reviewed   Family History              Mother: HTN, hyperlipidemia, hypothyroidism    Objective Information: General:unremarkable        Right ear lobe with laceration present, healing by secondary intention, stellate deformity, no infection present. Heart:  RRR, no murmur Lungs:    CTA bilaterally, no wheezes, rhonchi, rales.  Breathing unlabored.  Assessment:Laceration right ear  Diagnosis &amp; Procedure Smart Code   Plan:Scheduled for repair of the ear lobe on 10/22/12.   Patient Education:Alternative treatments to surgery were discussed with patient (and family).  Risks and benefits  of procedure were fully explained to the patient (and family) who gave informed consent. Patient/family questions were addressed.  Follow-up:Pending Surgery

## 2012-10-15 ENCOUNTER — Ambulatory Visit (INDEPENDENT_AMBULATORY_CARE_PROVIDER_SITE_OTHER): Payer: Managed Care, Other (non HMO) | Admitting: Internal Medicine

## 2012-10-15 ENCOUNTER — Encounter (INDEPENDENT_AMBULATORY_CARE_PROVIDER_SITE_OTHER): Payer: Self-pay | Admitting: Internal Medicine

## 2012-10-15 VITALS — BP 90/62 | HR 68 | Ht 61.0 in | Wt 184.7 lb

## 2012-10-15 DIAGNOSIS — K2100 Gastro-esophageal reflux disease with esophagitis, without bleeding: Secondary | ICD-10-CM

## 2012-10-15 NOTE — Patient Instructions (Addendum)
Continue Dexilant. OV prn .

## 2012-10-15 NOTE — Progress Notes (Signed)
Subjective:     Patient ID: Lindsay Howe, female   DOB: 30-May-1961, 52 y.o.   MRN: [id number]  HPI Here today for f/u. She tells me she is doing good. Acid reflux is better. Rarely has acid reflux. She is not watching her diet.  Appetite is good. No weight loss.   07/05/2012 EGD: Impression:  Erosive reflux esophagitis.  Small sliding hiatal hernia.  Erosive antral gastritis with the scar.  Bulbar erosions.  Esophagus dilated by passing 54 French Maloney dilator but no mucosal disruption induced.  Normal colonoscopy.  Review of Systems Current Outpatient Prescriptions  Medication Sig Dispense Refill  . BIOTIN PO Take 1 tablet by mouth daily.      Marland Kitchen buPROPion (WELLBUTRIN) 100 MG tablet Take 150 mg by mouth once.      . celecoxib (CELEBREX) 200 MG capsule Take 200 mg by mouth daily as needed for pain.      Marland Kitchen dexlansoprazole (DEXILANT) 60 MG capsule Take 1 capsule (60 mg total) by mouth daily.  30 capsule  5  . dicyclomine (BENTYL) 10 MG capsule Take 10 mg by mouth as needed.      Marland Kitchen escitalopram (LEXAPRO) 10 MG tablet Take 10 mg by mouth daily.      Marland Kitchen levothyroxine (SYNTHROID, LEVOTHROID) 125 MCG tablet Take 125 mcg by mouth daily.      . simvastatin (ZOCOR) 10 MG tablet Take 10 mg by mouth at bedtime.      Marland Kitchen zolpidem (AMBIEN) 5 MG tablet Take 5 mg by mouth at bedtime as needed for sleep.       No current facility-administered medications for this visit.   Past Surgical History  Procedure Laterality Date  . Thyroidectomy, partial    . Tubal ligation    . Breast enhancement surgery    . Carpal tunnel release      left  . Colonoscopy with esophagogastroduodenoscopy (egd)  07/05/2012    Procedure: COLONOSCOPY WITH ESOPHAGOGASTRODUODENOSCOPY (EGD);  Surgeon: Malissa Hippo, MD;  Location: [facility];  Service: Endoscopy;  Laterality: N/A;  1200   No Known Allergies     Objective:   Physical Exam  Filed Vitals:   10/15/12 1438  BP: 90/62  Pulse: 68  Height: 5\' 1"  (1.549 m)   Weight: 184 lb 11.2 oz (83.779 kg)   Alert and oriented. Skin warm and dry. Oral mucosa is moist.   . Sclera anicteric, conjunctivae is pink. Thyroid not enlarged. No cervical lymphadenopathy. Lungs clear. Heart regular rate and rhythm.  Abdomen is soft. Bowel sounds are positive. No hepatomegaly. No abdominal masses felt. No tenderness.  No edema to lower extremities.       Assessment:    Erosive esohagitis which is better. Rarely has acid reflux unless she has drank to much wine.    Plan:    Continue Dexilant. Try to follow a GERD diet. Loose weight. OV prn.

## 2012-10-16 NOTE — Patient Instructions (Signed)
Lindsay Howe  10/16/2012   Your procedure is scheduled on:  10/22/12  Report to [facility] at 08:45 AM.  Call this number if you have problems the morning of surgery: [phone]   Remember:   Do not eat food or drink liquids after midnight.   Take these medicines the morning of surgery with A SIP OF WATER:    Do not wear jewelry, make-up or nail polish.  Do not wear lotions, powders, or perfumes.   Do not shave 48 hours prior to surgery. Men may shave face and neck.  Do not bring valuables to the hospital.  Contacts, dentures or bridgework may not be worn into surgery.  Leave suitcase in the car. After surgery it may be brought to your room.  For patients admitted to the hospital, checkout time is 11:00 AM the day of  discharge.   Patients discharged the day of surgery will not be allowed to drive  home.    Special Instructions: Shower using CHG 2 nights before surgery and the night before surgery.  If you shower the day of surgery use CHG.  Use special wash - you have one bottle of CHG for all showers.  You should use approximately 1/3 of the bottle for each shower.   Please read over the following fact sheets that you were given: Pain Booklet, MRSA Information, Surgical Site Infection Prevention, Anesthesia Post-op Instructions and Care and Recovery After Surgery    PATIENT INSTRUCTIONS POST-ANESTHESIA  IMMEDIATELY FOLLOWING SURGERY:  Do not drive or operate machinery for the first twenty four hours after surgery.  Do not make any important decisions for twenty four hours after surgery or while taking narcotic pain medications or sedatives.  If you develop intractable nausea and vomiting or a severe headache please notify your doctor immediately.  FOLLOW-UP:  Please make an appointment with your surgeon as instructed. You do not need to follow up with anesthesia unless specifically instructed to do so.  WOUND CARE INSTRUCTIONS (if applicable):  Keep a dry clean dressing on the  anesthesia/puncture wound site if there is drainage.  Once the wound has quit draining you may leave it open to air.  Generally you should leave the bandage intact for twenty four hours unless there is drainage.  If the epidural site drains for more than 36-48 hours please call the anesthesia department.  QUESTIONS?:  Please feel free to call your physician or the hospital operator if you have any questions, and they will be happy to assist you.

## 2012-10-17 ENCOUNTER — Encounter (HOSPITAL_COMMUNITY)
Admission: RE | Admit: 2012-10-17 | Discharge: 2012-10-17 | Disposition: A | Discharge status: Home / Self Care | Payer: Managed Care, Other (non HMO) | Source: Ambulatory Visit | Attending: Internal Medicine | Admitting: Internal Medicine

## 2012-10-22 ENCOUNTER — Ambulatory Visit (HOSPITAL_COMMUNITY)
Admission: RE | Admit: 2012-10-22 | Payer: Managed Care, Other (non HMO) | Source: Ambulatory Visit | Admitting: General Surgery

## 2012-10-22 ENCOUNTER — Encounter (HOSPITAL_COMMUNITY): Admission: RE | Payer: Self-pay | Source: Ambulatory Visit

## 2012-10-22 SURGERY — REPAIR, LACERATION, 2 OR MORE
Anesthesia: General | Laterality: Right

## 2013-12-30 ENCOUNTER — Ambulatory Visit (HOSPITAL_COMMUNITY)
Admission: RE | Admit: 2013-12-30 | Discharge: 2013-12-30 | Disposition: A | Discharge status: Home / Self Care | Payer: Managed Care, Other (non HMO) | Source: Ambulatory Visit | Attending: Family Medicine | Admitting: Family Medicine

## 2013-12-30 ENCOUNTER — Other Ambulatory Visit (HOSPITAL_COMMUNITY): Payer: Self-pay | Admitting: Family Medicine

## 2013-12-30 DIAGNOSIS — S59919A Unspecified injury of unspecified forearm, initial encounter: Secondary | ICD-10-CM

## 2013-12-30 DIAGNOSIS — S6990XA Unspecified injury of unspecified wrist, hand and finger(s), initial encounter: Secondary | ICD-10-CM

## 2013-12-30 DIAGNOSIS — Z139 Encounter for screening, unspecified: Secondary | ICD-10-CM

## 2013-12-30 DIAGNOSIS — M25539 Pain in unspecified wrist: Secondary | ICD-10-CM | POA: Insufficient documentation

## 2013-12-30 DIAGNOSIS — S59909A Unspecified injury of unspecified elbow, initial encounter: Secondary | ICD-10-CM

## 2014-01-06 ENCOUNTER — Ambulatory Visit (HOSPITAL_COMMUNITY)
Admission: RE | Admit: 2014-01-06 | Discharge: 2014-01-06 | Disposition: A | Discharge status: Home / Self Care | Payer: Managed Care, Other (non HMO) | Source: Ambulatory Visit | Attending: Family Medicine | Admitting: Family Medicine

## 2014-01-06 DIAGNOSIS — Z139 Encounter for screening, unspecified: Secondary | ICD-10-CM

## 2014-01-06 DIAGNOSIS — Z13820 Encounter for screening for osteoporosis: Secondary | ICD-10-CM | POA: Insufficient documentation

## 2014-04-28 ENCOUNTER — Encounter (INDEPENDENT_AMBULATORY_CARE_PROVIDER_SITE_OTHER): Payer: Self-pay | Admitting: Internal Medicine

## 2014-09-02 ENCOUNTER — Other Ambulatory Visit (HOSPITAL_COMMUNITY): Payer: Self-pay | Admitting: Orthopedic Surgery

## 2014-09-02 DIAGNOSIS — M545 Low back pain: Secondary | ICD-10-CM

## 2014-09-08 ENCOUNTER — Ambulatory Visit (HOSPITAL_COMMUNITY)
Admission: RE | Admit: 2014-09-08 | Discharge: 2014-09-08 | Disposition: A | Discharge status: Home / Self Care | Payer: Managed Care, Other (non HMO) | Source: Ambulatory Visit | Attending: Orthopedic Surgery | Admitting: Orthopedic Surgery

## 2014-09-08 DIAGNOSIS — M545 Low back pain: Secondary | ICD-10-CM | POA: Insufficient documentation

## 2015-03-03 ENCOUNTER — Encounter: Payer: Self-pay | Admitting: Neurology

## 2015-03-03 ENCOUNTER — Ambulatory Visit (INDEPENDENT_AMBULATORY_CARE_PROVIDER_SITE_OTHER): Payer: Managed Care, Other (non HMO) | Admitting: Neurology

## 2015-03-03 VITALS — BP 124/72 | HR 68 | Resp 16 | Ht 62.0 in | Wt 190.0 lb

## 2015-03-03 DIAGNOSIS — G4719 Other hypersomnia: Secondary | ICD-10-CM

## 2015-03-03 DIAGNOSIS — R351 Nocturia: Secondary | ICD-10-CM

## 2015-03-03 DIAGNOSIS — G479 Sleep disorder, unspecified: Secondary | ICD-10-CM | POA: Diagnosis not present

## 2015-03-03 DIAGNOSIS — R0683 Snoring: Secondary | ICD-10-CM

## 2015-03-03 DIAGNOSIS — E669 Obesity, unspecified: Secondary | ICD-10-CM | POA: Diagnosis not present

## 2015-03-03 NOTE — Progress Notes (Signed)
Subjective:    Patient ID: Lindsay Howe is a 54 y.o. female.  HPI     Huston Foley, MD, PhD [facility] [address] [address] [place], [place] [address]  Dear Dr. Murray Hodgkins,   I saw your patient, Lindsay Howe, upon your kind request in my neurologic clinic today for initial consultation of her sleep disorder, in particular, concern for underlying obstructive sleep apnea. The patient is unaccompanied today. As you know, [name] is a very pleasant 54 year old right-handed woman with an underlying medical history of reflux disease, depression, hypothyroidism, migraine headaches and obesity, who reports snoring and excessive daytime somnolence. Snoring is reported to be generally speaking mild but she has woken herself up with a sense of gasping on a rare occasion. She denies morning headaches but has nocturia about once per night. She is retired. She used to work as a Charity fundraiser in a Industrial/product designer. She has a bedtime of around 9:30 PM and likes to read in bed. She does not watch TV in bed. She has tried to improve her sleep hygiene. She limits caffeine in the form of coffee to 3 cups in the morning only. Her husband used to snore but he has a dental appliance and his sleep does not bother her. She is a restless sleeper and admits to tossing and turning a lot. She has tried medications to help with sleep onset difficulties. She had side effects including parasomnias on Ambien. She had side effects with temazepam, primarily next a drowsiness. She has been trying Belsomra for the past few weeks. She takes it at 8 or 8:30 PM. Sleep onset is after about half an hour when she takes the medication. She takes it about twice per week on average. She has recently started hormone replacement therapy about 6 weeks ago including a strict and patch and progesterone pills. She has gained weight in the last couple of years. Her rise time is around 6 AM as she helps with childcare for her  grandson. She goes to her daughter's house for this. She does not take naps as she is worried that she will not be able to sleep at night. She feels sleepy during the day. Her Epworth sleepiness score is 13 out of a maximum of 24 today. Her fatigue score is 56 out of 63. She denies frank restless legs symptoms. She has no family history of obstructive sleep apnea. She drinks alcohol occasionally on weekends. She quit smoking in 2012. She would be willing to try a CPAP if needed.  I reviewed your office note from 01/08/2015, which you kindly included. She had an overnight pulse oximetry test through your office on 01/21/2015, which I reviewed: Total valid sampling time was 8 hours and 1 minute, average oxygen saturation 92.3%, nadir of 78%. Time below 89% saturation was 5 minutes and 46 seconds.  Her Past Medical History Is Significant For: Past Medical History  Diagnosis Date  . Thyroid disease   . Depression   . Hyperlipemia   . GERD (gastroesophageal reflux disease)   . Insomnia     Her Past Surgical History Is Significant For: Past Surgical History  Procedure Laterality Date  . Thyroidectomy, partial    . Tubal ligation    . Breast enhancement surgery    . Carpal tunnel release      left  . Colonoscopy with esophagogastroduodenoscopy (egd)  07/05/2012    Procedure: COLONOSCOPY WITH ESOPHAGOGASTRODUODENOSCOPY (EGD);  Surgeon: Malissa Hippo, MD;  [facility];  Service: Endoscopy;  Laterality: N/A;  1200  . Bladder repair      Her Family History Is Significant For: Family History  Problem Relation Age of Onset  . Hypothyroidism Mother   . Hypertension Mother   . Hyperlipidemia Mother   . Osteoporosis Mother   . Colon cancer Maternal Grandmother   . Stroke Maternal Grandfather     Her Social History Is Significant For: Social History   Social History  . Marital Status: Married    Spouse Name: Maisie Fus  . Number of Children: 2  . Years of Education: Associates    Occupational History  . Retired    Social History Main Topics  . Smoking status: Former Smoker -- 1.00 packs/day for 15 years    Types: Cigarettes    Quit date: 03/28/2011  . Smokeless tobacco: Never Used  . Alcohol Use: 1.8 oz/week    3 Glasses of wine per week  . Drug Use: No  . Sexual Activity: Yes    Birth Control/ Protection: Surgical   Other Topics Concern  . None   Social History Narrative   Drinks about 3 cups of coffee a day     Her Allergies Are:  No Known Allergies:   Her Current Medications Are:  Outpatient Encounter Prescriptions as of 03/03/2015  Medication Sig  . Biotin 7500 MCG TABS Take by mouth.  . co-enzyme Q-10 30 MG capsule Take 30 mg by mouth 3 (three) times daily.  . Collagen 500 MG CAPS Take by mouth.  . dexlansoprazole (DEXILANT) 60 MG capsule   . estradiol (CLIMARA - DOSED IN MG/24 HR) 0.0375 mg/24hr patch   . estradiol (CLIMARA - DOSED IN MG/24 HR) 0.05 mg/24hr patch   . ibuprofen (ADVIL,MOTRIN) 200 MG tablet Take 200 mg by mouth every 6 (six) hours as needed.  Marland Kitchen levothyroxine (SYNTHROID, LEVOTHROID) 100 MCG tablet   . Nutritional Supplements (DHEA PO) Take by mouth.  . Omega-3 Fatty Acids (FISH OIL) 1000 MG CAPS Take by mouth.  . progesterone (PROMETRIUM) 200 MG capsule   . Rose Oil OIL by Does not apply route.  . Suvorexant (BELSOMRA) 10 MG TABS   . [DISCONTINUED] Calcium Carbonate (CALTRATE 600 PO) Take by mouth.  . [DISCONTINUED] dicyclomine (BENTYL) 10 MG capsule Take 10 mg by mouth as needed.  . [DISCONTINUED] Green Tea, Camillia sinensis, (GREEN TEA EXTRACT PO) Take by mouth.  . [DISCONTINUED] levothyroxine (SYNTHROID, LEVOTHROID) 125 MCG tablet Take 125 mcg by mouth daily.  . [DISCONTINUED] Lorcaserin HCl (BELVIQ) 10 MG TABS Take by mouth.  . [DISCONTINUED] pravastatin (PRAVACHOL) 20 MG tablet Take 20 mg by mouth daily.  . [DISCONTINUED] rizatriptan (MAXALT) 10 MG tablet Take 10 mg by mouth as needed for migraine. May repeat in 2  hours if needed  . [DISCONTINUED] simvastatin (ZOCOR) 10 MG tablet Take 10 mg by mouth at bedtime.  . [DISCONTINUED] Suvorexant (BELSOMRA) 10 MG TABS Take by mouth.   No facility-administered encounter medications on file as of 03/03/2015.  :  Review of Systems:  Out of a complete 14 point review of systems, all are reviewed and negative with the exception of these symptoms as listed below:   Review of Systems  Musculoskeletal:       Joint pain   Neurological:       Trouble falling and staying asleep, snoring, wakes up choking in the night, wakes up feeling tired in the morning, daytime tiredness, denies taking naps.   Psychiatric/Behavioral:  Not enough sleep, decreased energy     Objective:  Neurologic Exam  Physical Exam Physical Examination:   Filed Vitals:   03/03/15 1006  BP: 124/72  Pulse: 68  Resp: 16    General Examination: The patient is a very pleasant 54 y.o. female in no acute distress. She appears well-developed and well-nourished and well groomed.   HEENT: Normocephalic, atraumatic, pupils are equal, round and reactive to light and accommodation. Funduscopic exam is normal with sharp disc margins noted. Extraocular tracking is good without limitation to gaze excursion or nystagmus noted. Normal smooth pursuit is noted. Hearing is grossly intact. Tympanic membranes are clear bilaterally. Face is symmetric with normal facial animation and normal facial sensation. Speech is clear with no dysarthria noted. There is no hypophonia. There is no lip, neck/head, jaw or voice tremor. Neck is supple with full range of passive and active motion. There are no carotid bruits on auscultation. Oropharynx exam reveals: mild mouth dryness, good dental hygiene and moderate airway crowding, due to narrow airway entry, tonsils of 2+ bilaterally, redundant soft palate with relatively larger appearing uvula. Mallampati is class II. Tongue protrudes centrally and palate elevates  symmetrically. Neck size is 13 7/8 inches. She has a very mild overbite. Nasal inspection reveals no significant nasal mucosal bogginess or redness and no septal deviation.   Chest: Clear to auscultation without wheezing, rhonchi or crackles noted.  Heart: S1+S2+0, regular and normal without murmurs, rubs or gallops noted.   Abdomen: Soft, non-tender and non-distended with normal bowel sounds appreciated on auscultation.  Extremities: There is no pitting edema in the distal lower extremities bilaterally. Pedal pulses are intact.  Skin: Warm and dry without trophic changes noted. There are no varicose veins.  Musculoskeletal: exam reveals no obvious joint deformities, tenderness or joint swelling or erythema.   Neurologically:  Mental status: The patient is awake, alert and oriented in all 4 spheres. Her immediate and remote memory, attention, language skills and fund of knowledge are appropriate. There is no evidence of aphasia, agnosia, apraxia or anomia. Speech is clear with normal prosody and enunciation. Thought process is linear. Mood is normal and affect is normal.  Cranial nerves II - XII are as described above under HEENT exam. In addition: shoulder shrug is normal with equal shoulder height noted. Motor exam: Normal bulk, strength and tone is noted. There is no drift, tremor or rebound. Romberg is negative. Reflexes are 2+ throughout. Babinski: Toes are flexor bilaterally. Fine motor skills and coordination: intact with normal finger taps, normal hand movements, normal rapid alternating patting, normal foot taps and normal foot agility.  Cerebellar testing: No dysmetria or intention tremor on finger to nose testing. Heel to shin is unremarkable bilaterally. There is no truncal or gait ataxia.  Sensory exam: intact to light touch, pinprick, vibration, temperature sense in the upper and lower extremities.  Gait, station and balance: She stands easily. No veering to one side is noted. No  leaning to one side is noted. Posture is age-appropriate and stance is narrow based. Gait shows normal stride length and normal pace. No problems turning are noted. She turns en bloc. Tandem walk is unremarkable.  Assessment and Plan:   In summary, MAKIYA JEUNE is a very pleasant 54 y.o.-year old female with an underlying medical history of reflux disease, depression, hypothyroidism, migraine headaches and obesity, whose history and physical exam are indeed concerning for underlying obstructive sleep apnea (OSA). I had a long chat with the patient about my findings  and the diagnosis of OSA, its prognosis and treatment options. We talked about medical treatments, surgical interventions and non-pharmacological approaches. I explained in particular the risks and ramifications of untreated moderate to severe OSA, especially with respect to developing cardiovascular disease down the Road, including congestive heart failure, difficult to treat hypertension, cardiac arrhythmias, or stroke. Even type 2 diabetes has, in part, been linked to untreated OSA. Symptoms of untreated OSA include daytime sleepiness, memory problems, mood irritability and mood disorder such as depression and anxiety, lack of energy, as well as recurrent headaches, especially morning headaches. We talked about trying to maintain a healthy lifestyle in general, as well as the importance of weight control. I encouraged the patient to eat healthy, exercise daily and keep well hydrated, to keep a scheduled bedtime and wake time routine, to not skip any meals and eat healthy snacks in between meals. I advised the patient not to drive when feeling sleepy. I recommended the following at this time: sleep study with potential positive airway pressure titration. (We will score hypopneas at 3% and split the sleep study into diagnostic and treatment portion, if the estimated. 2 hour AHI is >15/h, unless mandated otherwise by her insurance carrier).   I  explained the sleep test procedure to the patient and also outlined possible surgical and non-surgical treatment options of OSA, including the use of a custom-made dental device (which would require a referral to a specialist dentist or oral surgeon), upper airway surgical options, such as pillar implants, radiofrequency surgery, tongue base surgery, and UPPP (which would involve a referral to an ENT surgeon). Rarely, jaw surgery such as mandibular advancement may be considered.  I also explained the CPAP treatment option to the patient, who indicated that she would be willing to try CPAP if the need arises. I explained the importance of being compliant with PAP treatment, not only for insurance purposes but primarily to improve Her symptoms, and for the patient's long term health benefit, including to reduce Her cardiovascular risks. I answered all her questions today and the patient was in agreement. I would like to see her back after the sleep study is completed and encouraged her to call with any interim questions, concerns, problems or updates.   Thank you very much for allowing me to participate in the care of this nice patient. If I can be of any further assistance to you please do not hesitate to call me at [phone].  Sincerely,   Huston Foley, MD, PhD

## 2015-03-03 NOTE — Patient Instructions (Signed)

## 2015-03-12 ENCOUNTER — Ambulatory Visit: Payer: Managed Care, Other (non HMO) | Admitting: Physical Therapy

## 2015-03-24 ENCOUNTER — Ambulatory Visit (INDEPENDENT_AMBULATORY_CARE_PROVIDER_SITE_OTHER): Payer: Managed Care, Other (non HMO) | Admitting: Neurology

## 2015-03-24 ENCOUNTER — Ambulatory Visit (HOSPITAL_COMMUNITY): Payer: Managed Care, Other (non HMO) | Attending: Physician Assistant | Admitting: Physical Therapy

## 2015-03-24 DIAGNOSIS — M5441 Lumbago with sciatica, right side: Secondary | ICD-10-CM

## 2015-03-24 DIAGNOSIS — G4720 Circadian rhythm sleep disorder, unspecified type: Secondary | ICD-10-CM

## 2015-03-24 DIAGNOSIS — G4733 Obstructive sleep apnea (adult) (pediatric): Secondary | ICD-10-CM | POA: Diagnosis not present

## 2015-03-24 DIAGNOSIS — R293 Abnormal posture: Secondary | ICD-10-CM | POA: Insufficient documentation

## 2015-03-24 DIAGNOSIS — M6281 Muscle weakness (generalized): Secondary | ICD-10-CM | POA: Diagnosis present

## 2015-03-24 DIAGNOSIS — G4761 Periodic limb movement disorder: Secondary | ICD-10-CM

## 2015-03-24 DIAGNOSIS — M5442 Lumbago with sciatica, left side: Secondary | ICD-10-CM | POA: Insufficient documentation

## 2015-03-24 DIAGNOSIS — R262 Difficulty in walking, not elsewhere classified: Secondary | ICD-10-CM | POA: Insufficient documentation

## 2015-03-24 DIAGNOSIS — R198 Other specified symptoms and signs involving the digestive system and abdomen: Secondary | ICD-10-CM | POA: Diagnosis present

## 2015-03-24 NOTE — Patient Instructions (Signed)
   BRIDGING  While lying on your back, tighten your lower abdominals, squeeze your buttocks and then raise your buttocks off the floor/bed as creating a "Bridge" with your body.  Repeat 10 times, 2x/day.    HIP ABDUCTION - SIDELYING  While lying on your side, slowly raise up your top leg to the side. Keep your knee straight and maintain your toes pointed forward the entire time.   The bottom leg can be bent to stabilize your body.  Repeat 10 times, 2x/day.    PIRIFORMIS AND HIP STRETCH - SEATED  While sitting in a chair, cross your affected leg on top of the other as shown.   Next, gently lean forward until a stretch is felt along the crossed leg.  Hold for 30 seconds and release. Repeat at least 2 times each side, 2-3 times per day.

## 2015-03-24 NOTE — Sleep Study (Signed)
Please see the scanned sleep study interpretation located in the Procedure tab within the Chart Review section. 

## 2015-03-24 NOTE — Therapy (Signed)
[name] [address] [place], [place], [address] Phone: [phone]   Fax:  [phone]  Physical Therapy Evaluation  Patient Details  Name: Lindsay Howe MRN: [id number] Date of Birth: 1960-12-18 Referring Provider:  Albertina Senegal, PA  Encounter Date: 03/24/2015      PT End of Session - 03/24/15 1756    Visit Number 1   Number of Visits 12   Date for PT Re-Evaluation 04/21/15   Authorization Type [employer] (visit limit 90 days)   Authorization Time Period 03/24/15 to 05/24/15   PT Start Time 1603   PT Stop Time 1643   PT Time Calculation (min) 40 min   Activity Tolerance Patient tolerated treatment well   Behavior During Therapy [facility] for tasks assessed/performed      Past Medical History  Diagnosis Date  . Thyroid disease   . Depression   . Hyperlipemia   . GERD (gastroesophageal reflux disease)   . Insomnia     Past Surgical History  Procedure Laterality Date  . Thyroidectomy, partial    . Tubal ligation    . Breast enhancement surgery    . Carpal tunnel release      left  . Colonoscopy with esophagogastroduodenoscopy (egd)  07/05/2012    Procedure: COLONOSCOPY WITH ESOPHAGOGASTRODUODENOSCOPY (EGD);  Surgeon: Malissa Hippo, MD;  [facility];  Service: Endoscopy;  Laterality: N/A;  1200  . Bladder repair      There were no vitals filed for this visit.  Visit Diagnosis:  Left-sided low back pain with right-sided sciatica - Plan: PT plan of care cert/re-cert  Poor posture - Plan: PT plan of care cert/re-cert  Difficulty walking - Plan: PT plan of care cert/re-cert  Muscle weakness - Plan: PT plan of care cert/re-cert  Abdominal weakness - Plan: PT plan of care cert/re-cert      Subjective Assessment - 03/24/15 1606    Subjective Back is not really stiff; she usually has more of a burning sensation, and if she stoops for more than 30 seconds she does have back pain. No numbness or tingling.  Hurts and feels weak. No numbness or tingling, no issues with bowel or bladder.  The pain is worst at night when she is trying to sleep; pain will keep her awake if she does not take sleeping medication.    Pertinent History Pain started in her left hip about a year ago; she was seeing Dr. Charlann Boxer, who gave her injections and did MRI which showed some degenerative disc disease. She was then given a shot in her spine, which still didn't help. She then went to Dr. Genene Churn, who referred her to PT to see if this might help. They did find a cyst  on MRI and are going to follow up with a CT myelogram in 2 weeks.    How long can you sit comfortably? 30 minutes before she needs to shift    How long can you stand comfortably? 30 minutes    How long can you walk comfortably? Not sure, hasn't really pushed her walking distance recently    Patient Stated Goals Go back to walking, climb steps without grabbing railings and having to pull herself up, be able to move like she used to   Currently in Pain? No/denies            [facility] PT Assessment - 03/24/15 0001    Assessment   Medical Diagnosis degenerative lumbar discs  Onset Date/Surgical Date --  hip started about a year ago, back about 6 months ago    Next MD Visit not scheduled right now    Precautions   Precautions None   Restrictions   Weight Bearing Restrictions No   Balance Screen   Has the patient fallen in the past 6 months No   Has the patient had a decrease in activity level because of a fear of falling?  Yes   Is the patient reluctant to leave their home because of a fear of falling?  No   Prior Function   Level of Independence Independent;Independent with basic ADLs;Independent with transfers;Independent with gait   Vocation Unemployed   Leisure watch 37 year old grandson, re-doing house    Observation/Other Assessments   Observations no LLD noted; FABER and scour negative   Focus on Therapeutic Outcomes (FOTO)  46% limited     Posture/Postural Control   Posture Comments flexed at hips, forward head with B IR shoulders, B pronation, slight reduction in throacic curve and significant increase in lumbar lordosis    AROM   Right Hip External Rotation  --  WFL    Right Hip Internal Rotation  32   Left Hip External Rotation  --  WFL    Left Hip Internal Rotation  36   Lumbar Flexion 86   Lumbar Extension 26  pain    Lumbar - Right Side Bend --  fingers to knee jointline    Lumbar - Left Side Bend --  fingers to knee jointline    Strength   Overall Strength Comments core approx 3+ to 4-/5   Right Hip Flexion 4/5   Right Hip Extension 4-/5   Right Hip ABduction 4-/5   Left Hip Flexion 4/5   Left Hip Extension 4-/5   Left Hip ABduction 4-/5   Right Knee Flexion 3+/5   Right Knee Extension 4/5   Left Knee Flexion 4-/5   Left Knee Extension 4/5   Right Ankle Dorsiflexion 5/5   Left Ankle Dorsiflexion 4/5   Palpation   Palpation comment gross tenderness noted over R piriformis and gluteals, R side of low back extensors and R SI area    Ambulation/Gait   Gait Comments proximal muscle weakness, B pronation, flexed at hips, some possible hip stifffness                            PT Education - 03/24/15 1756    Education provided Yes   Education Details prognosis, HEP, plan of care    Person(s) Educated Patient   Methods Explanation;Handout   Comprehension Returned demonstration          PT Short Term Goals - 03/24/15 1805    PT SHORT TERM GOAL #1   Title Patient will demonstrate bilateral hip IR ROM of 45 degrees in order to improve functional mechanics and reduce stress on hips and back    Time 3   Period Weeks   Status New   PT SHORT TERM GOAL #2   Title Patient will demonstrate reduced muscle tension and knotting in R side of lumbar area, as well as R pirformis and gluteals    Time 3   Period Weeks   Status New   PT SHORT TERM GOAL #3   Title Patient to report that her  pain at night has decreased to a point where she is able to sleep for at least 3  hours at a time with no pain or sleeping medications    Time 3   Period Weeks   Status New   PT SHORT TERM GOAL #4   Title Patient to be independent in correctly and consistently performing appropriate HEP, to be updated PRN    Time 3   Period Weeks   Status New           PT Long Term Goals - 03/24/15 1808    PT LONG TERM GOAL #1   Title Patient will demonstrate bilateral lower extremity strength 4+/5 and proximal muscle/core strength 4+/5 in order to reduce pain and allow her to perform functional tasks such as stair climbing with minimal use of UEs    Time 6   Period Weeks   Status New   PT LONG TERM GOAL #2   Title Patient will demonstrate appropriate posture during all functional standing and sitting tasks at least 90% of the time with only occasional cues for correct postural mechanics    Time 6   Period Weeks   Status New   PT LONG TERM GOAL #3   Title Patient to report that she has been able to sleep through the night with no pain or sleeping medications and without waking up due to pain    Time 6   Period Weeks   Status New   PT LONG TERM GOAL #4   Title Patient to perform regular physical activity, of at least 20 minutes in duration, at least 3 times per week, in order to maintain functional gains and assist her in achieving improved health habits    Time 6   Period Weeks   Status New   PT LONG TERM GOAL #5   Title Patient to be able to perform sitting and standing activities for at least 90 minutes with pain no more than 2/10 low back and R hip    Time 6   Period Weeks   Status New               Plan - 03/24/15 1759    Clinical Impression Statement Patient presents with back pain taht she reports originated with pain in her R hip and developed into LBP without aggravating factor or injury; imaging showed L5 cyst that is to be further investigated with imaging. Patient shows  some muscle stiffness and knotting,  postural deficits including signfiicant lumbar lordosis, muscle weakness, gait impairments, pain, reduced functional activity tolerance, and reduced functional task performancce skiills. Patient reports that she really is not involved in much exercise right now. At this time patient will benefit from skilled PT services to address her functional limitations and assist her in reaching an optimal level of function.    Pt will benefit from skilled therapeutic intervention in order to improve on the following deficits Decreased endurance;Hypomobility;Decreased activity tolerance;Decreased strength;Pain;Difficulty walking;Increased muscle spasms;Improper body mechanics;Decreased coordination;Impaired flexibility;Postural dysfunction   Rehab Potential Good   PT Frequency 2x / week   PT Duration 6 weeks   PT Treatment/Interventions ADLs/Self Care Home Management;Gait training;Functional mobility training;Therapeutic activities;Therapeutic exercise;Balance training;Neuromuscular re-education;Patient/family education;Manual techniques   PT Next Visit Plan review HEP and goals; fnctional stretching and strengthening, core stabilization as appropriate    PT Home Exercise Plan given    Consulted and Agree with Plan of Care Patient         Problem List Patient Active Problem List   Diagnosis Date Noted  . Reflux esophagitis 10/15/2012  . Hypothyroidism 06/06/2012  .  GERD (gastroesophageal reflux disease) 06/06/2012  . Dysphagia 06/06/2012    Nedra Hai PT, DPT [phone]  [facility] [address] [place], [place], [address] Phone: [phone]   Fax:  [phone]

## 2015-03-27 ENCOUNTER — Telehealth: Payer: Self-pay | Admitting: Neurology

## 2015-03-27 DIAGNOSIS — G4720 Circadian rhythm sleep disorder, unspecified type: Secondary | ICD-10-CM

## 2015-03-27 DIAGNOSIS — G4733 Obstructive sleep apnea (adult) (pediatric): Secondary | ICD-10-CM

## 2015-03-27 DIAGNOSIS — G4761 Periodic limb movement disorder: Secondary | ICD-10-CM

## 2015-03-27 NOTE — Telephone Encounter (Signed)
Please call and notify the patient that the recent sleep study did confirm the diagnosis of obstructive sleep apnea and that I recommend treatment for this in the form of CPAP. This will require a repeat sleep study for proper titration and mask fitting. Please explain to patient and arrange for a CPAP titration study. I have placed an order in the chart. Thanks, and please route to [facility] for scheduling.   Huston Foley, MD, PhD [facility])

## 2015-03-31 ENCOUNTER — Ambulatory Visit (HOSPITAL_COMMUNITY): Payer: Managed Care, Other (non HMO) | Attending: Neurology | Admitting: Physical Therapy

## 2015-03-31 DIAGNOSIS — M6281 Muscle weakness (generalized): Secondary | ICD-10-CM | POA: Insufficient documentation

## 2015-03-31 DIAGNOSIS — R198 Other specified symptoms and signs involving the digestive system and abdomen: Secondary | ICD-10-CM | POA: Diagnosis present

## 2015-03-31 DIAGNOSIS — M5441 Lumbago with sciatica, right side: Secondary | ICD-10-CM

## 2015-03-31 DIAGNOSIS — M5442 Lumbago with sciatica, left side: Secondary | ICD-10-CM | POA: Insufficient documentation

## 2015-03-31 DIAGNOSIS — R293 Abnormal posture: Secondary | ICD-10-CM | POA: Diagnosis present

## 2015-03-31 DIAGNOSIS — R262 Difficulty in walking, not elsewhere classified: Secondary | ICD-10-CM

## 2015-03-31 NOTE — Therapy (Signed)
[facility] [address] [place], [place], [address] Phone: [phone]   Fax:  [phone]  Physical Therapy Treatment  Patient Details  Name: Lindsay Howe MRN: [id number] Date of Birth: 08/19/1960 Referring Provider:  Beryle Beams, MD  Encounter Date: 03/31/2015      PT End of Session - 03/31/15 1007    Visit Number 2   Number of Visits 12   Date for PT Re-Evaluation 04/21/15   Authorization Type [employer] (visit limit 90 days)   Authorization Time Period 03/24/15 to 05/24/15   PT Start Time 0930   PT Stop Time 1010   PT Time Calculation (min) 40 min   Activity Tolerance Patient tolerated treatment well   Behavior During Therapy [facility] for tasks assessed/performed      Past Medical History  Diagnosis Date  . Thyroid disease   . Depression   . Hyperlipemia   . GERD (gastroesophageal reflux disease)   . Insomnia     Past Surgical History  Procedure Laterality Date  . Thyroidectomy, partial    . Tubal ligation    . Breast enhancement surgery    . Carpal tunnel release      left  . Colonoscopy with esophagogastroduodenoscopy (egd)  07/05/2012    Procedure: COLONOSCOPY WITH ESOPHAGOGASTRODUODENOSCOPY (EGD);  Surgeon: Malissa Hippo, MD;  [facility];  Service: Endoscopy;  Laterality: N/A;  1200  . Bladder repair      There were no vitals filed for this visit.  Visit Diagnosis:  Left-sided low back pain with right-sided sciatica  Poor posture  Difficulty walking  Muscle weakness  Abdominal weakness      Subjective Assessment - 03/31/15 0932    Subjective Patient reports that she is having some muscle soreness from her HEP but feels good toherwise, however does not notice any real changes in her back pain yet.    Pertinent History Pain started in her left hip about a year ago; she was seeing Dr. Charlann Boxer, who gave her injections and did MRI which showed some degenerative disc disease. She was then given a  shot in her spine, which still didn't help. She then went to Dr. Genene Churn, who referred her to PT to see if this might help. They did find a cyst  on MRI and are going to follow up with a CT myelogram in 2 weeks.    Currently in Pain? No/denies  just some soreness in her back                          [facility] Adult PT Treatment/Exercise - 03/31/15 0001    Lumbar Exercises: Stretches   Active Hamstring Stretch 3 reps;30 seconds   Active Hamstring Stretch Limitations 12 inch box    Passive Hamstring Stretch 3 reps;30 seconds   Passive Hamstring Stretch Limitations slantboard    Double Knee to Chest Stretch 5 reps;10 seconds   Lower Trunk Rotation 3 reps;5 reps;10 seconds   Piriformis Stretch 3 reps;30 seconds   Piriformis Stretch Limitations seated   Lumbar Exercises: Aerobic   Tread Mill 5 minutes at 1.7MPH and 2% grade, ab sets    Lumbar Exercises: Standing   Heel Raises 10 reps   Forward Lunge 10 reps   Forward Lunge Limitations 4 inch box    Side Lunge 10 reps   Side Lunge Limitations 4 inch box    Scapular Retraction 15 reps   Other Standing Lumbar  Exercises Posterior shoulder rolls 1x20; forward step ups 1x10 B    Other Standing Lumbar Exercises 3D hip excursions 1x15    Lumbar Exercises: Supine   Ab Set 20 reps   AB Set Limitations 3 second holds    Dead Bug 10 reps   Bridge 10 reps   Straight Leg Raise 10 reps                PT Education - 03/31/15 1007    Education provided Yes   Education Details reviewed initial eval and goals; importance of activating core and proximal muscles    Person(s) Educated Patient   Methods Explanation;Handout   Comprehension Verbalized understanding          PT Short Term Goals - 03/24/15 1805    PT SHORT TERM GOAL #1   Title Patient will demonstrate bilateral hip IR ROM of 45 degrees in order to improve functional mechanics and reduce stress on hips and back    Time 3   Period Weeks   Status New   PT SHORT  TERM GOAL #2   Title Patient will demonstrate reduced muscle tension and knotting in R side of lumbar area, as well as R pirformis and gluteals    Time 3   Period Weeks   Status New   PT SHORT TERM GOAL #3   Title Patient to report that her pain at night has decreased to a point where she is able to sleep for at least 3 hours at a time with no pain or sleeping medications    Time 3   Period Weeks   Status New   PT SHORT TERM GOAL #4   Title Patient to be independent in correctly and consistently performing appropriate HEP, to be updated PRN    Time 3   Period Weeks   Status New           PT Long Term Goals - 03/24/15 1808    PT LONG TERM GOAL #1   Title Patient will demonstrate bilateral lower extremity strength 4+/5 and proximal muscle/core strength 4+/5 in order to reduce pain and allow her to perform functional tasks such as stair climbing with minimal use of UEs    Time 6   Period Weeks   Status New   PT LONG TERM GOAL #2   Title Patient will demonstrate appropriate posture during all functional standing and sitting tasks at least 90% of the time with only occasional cues for correct postural mechanics    Time 6   Period Weeks   Status New   PT LONG TERM GOAL #3   Title Patient to report that she has been able to sleep through the night with no pain or sleeping medications and without waking up due to pain    Time 6   Period Weeks   Status New   PT LONG TERM GOAL #4   Title Patient to perform regular physical activity, of at least 20 minutes in duration, at least 3 times per week, in order to maintain functional gains and assist her in achieving improved health habits    Time 6   Period Weeks   Status New   PT LONG TERM GOAL #5   Title Patient to be able to perform sitting and standing activities for at least 90 minutes with pain no more than 2/10 low back and R hip    Time 6   Period Weeks   Status New  Plan - 03/31/15 1007    Clinical  Impression Statement Introduced functional stretching and strengthening today with good tolerance by patient; reviewed inital eval and goals today. Patient able to perform all activities with good form today, no increase in pain during session, however did experience some R knee pain during gait on treadmill.    Pt will benefit from skilled therapeutic intervention in order to improve on the following deficits Decreased endurance;Hypomobility;Decreased activity tolerance;Decreased strength;Pain;Difficulty walking;Increased muscle spasms;Improper body mechanics;Decreased coordination;Impaired flexibility;Postural dysfunction   Rehab Potential Good   PT Frequency 2x / week   PT Duration 6 weeks   PT Treatment/Interventions ADLs/Self Care Home Management;Gait training;Functional mobility training;Therapeutic activities;Therapeutic exercise;Balance training;Neuromuscular re-education;Patient/family education;Manual techniques   PT Next Visit Plan continue functional strength and stretching; core stabilization    PT Home Exercise Plan given    Consulted and Agree with Plan of Care Patient        Problem List Patient Active Problem List   Diagnosis Date Noted  . Reflux esophagitis 10/15/2012  . Hypothyroidism 06/06/2012  . GERD (gastroesophageal reflux disease) 06/06/2012  . Dysphagia 06/06/2012    Nedra Hai PT, DPT [phone]  [facility] [address] [place], [place], [address] Phone: [phone]   Fax:  [phone]

## 2015-04-02 ENCOUNTER — Ambulatory Visit (HOSPITAL_COMMUNITY): Payer: Managed Care, Other (non HMO)

## 2015-04-02 NOTE — Telephone Encounter (Signed)
I spoke to [place] and she is aware of results. I will fax report to PCP. Patient would like to proceed with second study.

## 2015-04-07 ENCOUNTER — Ambulatory Visit (HOSPITAL_COMMUNITY): Payer: Managed Care, Other (non HMO) | Admitting: Physical Therapy

## 2015-04-07 DIAGNOSIS — M6281 Muscle weakness (generalized): Secondary | ICD-10-CM

## 2015-04-07 DIAGNOSIS — R262 Difficulty in walking, not elsewhere classified: Secondary | ICD-10-CM

## 2015-04-07 DIAGNOSIS — M5441 Lumbago with sciatica, right side: Secondary | ICD-10-CM

## 2015-04-07 DIAGNOSIS — R198 Other specified symptoms and signs involving the digestive system and abdomen: Secondary | ICD-10-CM

## 2015-04-07 DIAGNOSIS — M5442 Lumbago with sciatica, left side: Secondary | ICD-10-CM | POA: Diagnosis not present

## 2015-04-07 DIAGNOSIS — R293 Abnormal posture: Secondary | ICD-10-CM

## 2015-04-07 NOTE — Therapy (Signed)
[facility] [address] [place], [place], [address] Phone: [phone]   Fax:  [phone]  Physical Therapy Treatment  Patient Details  Name: Lindsay Howe MRN: [id number] Date of Birth: 1960/09/30 Referring Provider:  Beryle Beams, MD  Encounter Date: 04/07/2015      PT End of Session - 04/07/15 1718    Visit Number 3   Number of Visits 12   Date for PT Re-Evaluation 04/21/15   Authorization Type [employer] (visit limit 90 days)   Authorization Time Period 03/24/15 to 05/24/15   PT Start Time 1630   PT Stop Time 1718   PT Time Calculation (min) 48 min   Activity Tolerance Patient tolerated treatment well   Behavior During Therapy [facility] for tasks assessed/performed      Past Medical History  Diagnosis Date  . Thyroid disease   . Depression   . Hyperlipemia   . GERD (gastroesophageal reflux disease)   . Insomnia     Past Surgical History  Procedure Laterality Date  . Thyroidectomy, partial    . Tubal ligation    . Breast enhancement surgery    . Carpal tunnel release      left  . Colonoscopy with esophagogastroduodenoscopy (egd)  07/05/2012    Procedure: COLONOSCOPY WITH ESOPHAGOGASTRODUODENOSCOPY (EGD);  Surgeon: Malissa Hippo, MD;  [facility];  Service: Endoscopy;  Laterality: N/A;  1200  . Bladder repair      There were no vitals filed for this visit.  Visit Diagnosis:  Left-sided low back pain with right-sided sciatica  Poor posture  Difficulty walking  Abdominal weakness  Muscle weakness      Subjective Assessment - 04/07/15 1638    Subjective PT states she is doing alot better, so much so she wants to postpone her myelogram.  Pt reports currently without pain, however did have some last night at 5/10.   Currently in Pain? No/denies                         [facility] Adult PT Treatment/Exercise - 04/07/15 1640    Lumbar Exercises: Stretches   Active Hamstring Stretch 3  reps;30 seconds   Active Hamstring Stretch Limitations 12 inch box    Passive Hamstring Stretch 3 reps;30 seconds   Passive Hamstring Stretch Limitations slantboard    Double Knee to Chest Stretch 5 reps;10 seconds   Lower Trunk Rotation 3 reps;5 reps;10 seconds   Piriformis Stretch 3 reps;30 seconds   Piriformis Stretch Limitations seated   Lumbar Exercises: Aerobic   Tread Mill 5 minutes at 1.7MPH and 2% grade, ab sets    Lumbar Exercises: Standing   Heel Raises 15 reps   Heel Raises Limitations toeraises 15 reps   Forward Lunge 15 reps   Forward Lunge Limitations 4 inch box    Side Lunge 10 reps   Side Lunge Limitations 4 inch box    Scapular Retraction 10 reps;Theraband   Theraband Level (Scapular Retraction) Level 2 (Red)   Row 10 reps;Theraband   Theraband Level (Row) Level 2 (Red)   Shoulder Extension 10 reps;Theraband   Theraband Level (Shoulder Extension) Level 2 (Red)   Other Standing Lumbar Exercises 3D hip excursions 1x15    Lumbar Exercises: Supine   Ab Set 20 reps   AB Set Limitations 3 second holds    Bridge 15 reps   Straight Leg Raise 15 reps  PT Short Term Goals - 03/24/15 1805    PT SHORT TERM GOAL #1   Title Patient will demonstrate bilateral hip IR ROM of 45 degrees in order to improve functional mechanics and reduce stress on hips and back    Time 3   Period Weeks   Status New   PT SHORT TERM GOAL #2   Title Patient will demonstrate reduced muscle tension and knotting in R side of lumbar area, as well as R pirformis and gluteals    Time 3   Period Weeks   Status New   PT SHORT TERM GOAL #3   Title Patient to report that her pain at night has decreased to a point where she is able to sleep for at least 3 hours at a time with no pain or sleeping medications    Time 3   Period Weeks   Status New   PT SHORT TERM GOAL #4   Title Patient to be independent in correctly and consistently performing appropriate HEP, to be updated  PRN    Time 3   Period Weeks   Status New           PT Long Term Goals - 03/24/15 1808    PT LONG TERM GOAL #1   Title Patient will demonstrate bilateral lower extremity strength 4+/5 and proximal muscle/core strength 4+/5 in order to reduce pain and allow her to perform functional tasks such as stair climbing with minimal use of UEs    Time 6   Period Weeks   Status New   PT LONG TERM GOAL #2   Title Patient will demonstrate appropriate posture during all functional standing and sitting tasks at least 90% of the time with only occasional cues for correct postural mechanics    Time 6   Period Weeks   Status New   PT LONG TERM GOAL #3   Title Patient to report that she has been able to sleep through the night with no pain or sleeping medications and without waking up due to pain    Time 6   Period Weeks   Status New   PT LONG TERM GOAL #4   Title Patient to perform regular physical activity, of at least 20 minutes in duration, at least 3 times per week, in order to maintain functional gains and assist her in achieving improved health habits    Time 6   Period Weeks   Status New   PT LONG TERM GOAL #5   Title Patient to be able to perform sitting and standing activities for at least 90 minutes with pain no more than 2/10 low back and R hip    Time 6   Period Weeks   Status New               Plan - 04/07/15 1719    Clinical Impression Statement Continued with functional stretching and strengthening with progression of reps.  Also added postural theraband exercises with overall good form and control.   Pt voiced Rt lateral knee discomfort again following treadmill.  Introduced ITB stretch with good results.  Pt progressing well and is no longer having pain at night as she was originally.      PT Next Visit Plan continue functional strength and stretching; core stabilization    Consulted and Agree with Plan of Care Patient        Problem List Patient Active Problem  List   Diagnosis Date Noted  . Reflux  esophagitis 10/15/2012  . Hypothyroidism 06/06/2012  . GERD (gastroesophageal reflux disease) 06/06/2012  . Dysphagia 06/06/2012    Lurena Nida, PTA/CLT [phone] 04/07/2015, 5:26 PM  [facility] [address] [place], [place], [address] Phone: [phone]   Fax:  [phone]

## 2015-04-09 ENCOUNTER — Ambulatory Visit (HOSPITAL_COMMUNITY): Payer: Managed Care, Other (non HMO) | Admitting: Physical Therapy

## 2015-04-09 DIAGNOSIS — R293 Abnormal posture: Secondary | ICD-10-CM

## 2015-04-09 DIAGNOSIS — M5442 Lumbago with sciatica, left side: Secondary | ICD-10-CM | POA: Diagnosis not present

## 2015-04-09 DIAGNOSIS — R198 Other specified symptoms and signs involving the digestive system and abdomen: Secondary | ICD-10-CM

## 2015-04-09 DIAGNOSIS — R262 Difficulty in walking, not elsewhere classified: Secondary | ICD-10-CM

## 2015-04-09 DIAGNOSIS — M5441 Lumbago with sciatica, right side: Secondary | ICD-10-CM

## 2015-04-09 DIAGNOSIS — M6281 Muscle weakness (generalized): Secondary | ICD-10-CM

## 2015-04-09 NOTE — Therapy (Signed)
[facility] [address] [place], [place], [address] Phone: [phone]   Fax:  [phone]  Physical Therapy Treatment  Patient Details  Name: Lindsay Howe H Mccartney MRN: [id number] Date of Birth: 1960/12/31 No Data Recorded  Encounter Date: 04/09/2015      PT End of Session - 04/09/15 1800    Visit Number 4   Number of Visits 12   Date for PT Re-Evaluation 04/21/15   Authorization Type [employer] (visit limit 90 days)   Authorization Time Period 03/24/15 to 05/24/15   PT Start Time 1650   PT Stop Time 1740   PT Time Calculation (min) 50 min   Activity Tolerance Patient tolerated treatment well   Behavior During Therapy [facility] for tasks assessed/performed      Past Medical History  Diagnosis Date  . Thyroid disease   . Depression   . Hyperlipemia   . GERD (gastroesophageal reflux disease)   . Insomnia     Past Surgical History  Procedure Laterality Date  . Thyroidectomy, partial    . Tubal ligation    . Breast enhancement surgery    . Carpal tunnel release      left  . Colonoscopy with esophagogastroduodenoscopy (egd)  07/05/2012    Procedure: COLONOSCOPY WITH ESOPHAGOGASTRODUODENOSCOPY (EGD);  Surgeon: Malissa HippoNajeeb U Rehman, MD;  [facility];  Service: Endoscopy;  Laterality: N/A;  1200  . Bladder repair      There were no vitals filed for this visit.  Visit Diagnosis:  Left-sided low back pain with right-sided sciatica  Poor posture  Difficulty walking  Abdominal weakness  Muscle weakness      Subjective Assessment - 04/09/15 1655    Subjective Pt states she has no pain currently but did have a little pain this morining in her knee; States she got in touch with neuro and got her myelegram cancelled since she is improving.    Currently in Pain? No/denies                         [facility] Adult PT Treatment/Exercise - 04/09/15 0001    Lumbar Exercises: Stretches   Active Hamstring Stretch 3 reps;30  seconds   Active Hamstring Stretch Limitations 12 inch box    Passive Hamstring Stretch 3 reps;30 seconds   Passive Hamstring Stretch Limitations slantboard    Lumbar Exercises: Aerobic   Tread Mill 8 minutes at 1.7MPH and 2% grade, ab sets    Lumbar Exercises: Standing   Forward Lunge 15 reps   Forward Lunge Limitations 4 inch box    Side Lunge 15 reps   Side Lunge Limitations 4 inch box    Scapular Retraction 15 reps   Theraband Level (Scapular Retraction) Level 2 (Red)   Row 15 reps   Theraband Level (Row) Level 2 (Red)   Shoulder Extension 15 reps   Theraband Level (Shoulder Extension) Level 2 (Red)   Other Standing Lumbar Exercises 3D hip excursions 1x15    Lumbar Exercises: Quadruped   Single Arm Raise 5 reps   Straight Leg Raise 5 reps   Opposite Arm/Leg Raise 5 reps                  PT Short Term Goals - 03/24/15 1805    PT SHORT TERM GOAL #1   Title Patient will demonstrate bilateral hip IR ROM of 45 degrees in order to improve functional mechanics and reduce stress on hips and back  Time 3   Period Weeks   Status New   PT SHORT TERM GOAL #2   Title Patient will demonstrate reduced muscle tension and knotting in R side of lumbar area, as well as R pirformis and gluteals    Time 3   Period Weeks   Status New   PT SHORT TERM GOAL #3   Title Patient to report that her pain at night has decreased to a point where she is able to sleep for at least 3 hours at a time with no pain or sleeping medications    Time 3   Period Weeks   Status New   PT SHORT TERM GOAL #4   Title Patient to be independent in correctly and consistently performing appropriate HEP, to be updated PRN    Time 3   Period Weeks   Status New           PT Long Term Goals - 03/24/15 1808    PT LONG TERM GOAL #1   Title Patient will demonstrate bilateral lower extremity strength 4+/5 and proximal muscle/core strength 4+/5 in order to reduce pain and allow her to perform functional  tasks such as stair climbing with minimal use of UEs    Time 6   Period Weeks   Status New   PT LONG TERM GOAL #2   Title Patient will demonstrate appropriate posture during all functional standing and sitting tasks at least 90% of the time with only occasional cues for correct postural mechanics    Time 6   Period Weeks   Status New   PT LONG TERM GOAL #3   Title Patient to report that she has been able to sleep through the night with no pain or sleeping medications and without waking up due to pain    Time 6   Period Weeks   Status New   PT LONG TERM GOAL #4   Title Patient to perform regular physical activity, of at least 20 minutes in duration, at least 3 times per week, in order to maintain functional gains and assist her in achieving improved health habits    Time 6   Period Weeks   Status New   PT LONG TERM GOAL #5   Title Patient to be able to perform sitting and standing activities for at least 90 minutes with pain no more than 2/10 low back and R hip    Time 6   Period Weeks   Status New               Plan - 04/09/15 1800    Clinical Impression Statement Pt is overall progressing without c/o pain other than Rt knee pain.  Introduced prone stabilization exericses with noted weakness in UE and glutes.  Pt without complaints with any other actvities today.    PT Next Visit Plan continue functional strength and stretching; core stabilization    Consulted and Agree with Plan of Care Patient        Problem List Patient Active Problem List   Diagnosis Date Noted  . Reflux esophagitis 10/15/2012  . Hypothyroidism 06/06/2012  . GERD (gastroesophageal reflux disease) 06/06/2012  . Dysphagia 06/06/2012    Lurena Nida, PTA/CLT [phone] 04/09/2015, 6:03 PM  [facility] [address] [place], [place], [address] Phone: [phone]   Fax:  [phone]  Name: ALICIA SEIB MRN: [id number] Date of Birth:  05-20-1961

## 2015-04-10 ENCOUNTER — Telehealth (HOSPITAL_COMMUNITY): Payer: Self-pay

## 2015-04-10 NOTE — Telephone Encounter (Signed)
Patient unable to come ,she needed a later appt.

## 2015-04-14 ENCOUNTER — Ambulatory Visit (HOSPITAL_COMMUNITY): Payer: Managed Care, Other (non HMO) | Admitting: Physical Therapy

## 2015-04-14 ENCOUNTER — Ambulatory Visit (INDEPENDENT_AMBULATORY_CARE_PROVIDER_SITE_OTHER): Payer: Managed Care, Other (non HMO) | Admitting: Neurology

## 2015-04-14 DIAGNOSIS — G4733 Obstructive sleep apnea (adult) (pediatric): Secondary | ICD-10-CM | POA: Diagnosis not present

## 2015-04-14 DIAGNOSIS — G4761 Periodic limb movement disorder: Secondary | ICD-10-CM

## 2015-04-14 DIAGNOSIS — G479 Sleep disorder, unspecified: Secondary | ICD-10-CM

## 2015-04-15 NOTE — Sleep Study (Signed)
Please see the scanned sleep study interpretation located in the Procedure tab within the Chart Review section. 

## 2015-04-16 ENCOUNTER — Ambulatory Visit (HOSPITAL_COMMUNITY): Payer: Managed Care, Other (non HMO) | Admitting: Physical Therapy

## 2015-04-20 ENCOUNTER — Telehealth: Payer: Self-pay | Admitting: Neurology

## 2015-04-20 DIAGNOSIS — G4733 Obstructive sleep apnea (adult) (pediatric): Secondary | ICD-10-CM

## 2015-04-20 NOTE — Telephone Encounter (Signed)
Patient of Dr. Sharyon MedicusFusco's, seen by me on 03/03/15, PSG on 03/24/15, CPAP study on 04/14/15, Ins: [employer].  Please call and inform patient that I have entered an order for treatment with positive airway pressure (PAP) treatment of obstructive sleep apnea (OSA). She did well during the latest sleep study with CPAP. We will, therefore, arrange for a machine for home use through a DME (durable medical equipment) company of Her choice; and I will see the patient back in follow-up in about 8-10 weeks. Please also explain to the patient that I will be looking out for compliance data, which can be downloaded from the machine (stored on an SD card, that is inserted in the machine) or via remote access through a modem, that is built into the machine. At the time of the followup appointment we will discuss sleep study results and how it is going with PAP treatment at home. Please advise patient to bring Her machine at the time of the first FU visit, even though this is cumbersome. Bringing the machine for every visit after that will likely not be needed, but often helps for the first visit to troubleshoot if needed. Please re-enforce the importance of compliance with treatment and the need for us to monitor compliance data - often an insurance requirement and actually good feedback for the patient as far as how they are doing.  Also remind patient, that any interim PAP machine or mask issues should be first addressed with the DME company, as they can often help better with technical and mask fit issues. Please ask if patient has a preference regarding DME company.  Please also make sure, the patient has a follow-up appointment with me in about 8-10 weeks from the setup date, thanks.  Once you have spoken to the patient - and faxed/routed report to PCP and referring MD (if other than PCP), you can close this encounter, thanks,   Huston FoleySaima Mikaiya Tramble, MD, PhD [facility]

## 2015-04-21 ENCOUNTER — Encounter (HOSPITAL_COMMUNITY): Payer: Managed Care, Other (non HMO) | Admitting: Physical Therapy

## 2015-04-21 NOTE — Telephone Encounter (Signed)
I spoke to patient and gave results. She would like to proceed with treatment. [facility] Pt made f/u appt. I send patient a letter remind her to keep appt and stress the importance of compliance.

## 2015-04-21 NOTE — Telephone Encounter (Signed)
Left message for patient to call back for results.  

## 2015-04-23 ENCOUNTER — Ambulatory Visit (HOSPITAL_COMMUNITY): Payer: Managed Care, Other (non HMO) | Admitting: Physical Therapy

## 2015-04-23 DIAGNOSIS — R293 Abnormal posture: Secondary | ICD-10-CM

## 2015-04-23 DIAGNOSIS — M5441 Lumbago with sciatica, right side: Secondary | ICD-10-CM

## 2015-04-23 DIAGNOSIS — M5442 Lumbago with sciatica, left side: Secondary | ICD-10-CM | POA: Diagnosis not present

## 2015-04-23 DIAGNOSIS — M6281 Muscle weakness (generalized): Secondary | ICD-10-CM

## 2015-04-23 DIAGNOSIS — R198 Other specified symptoms and signs involving the digestive system and abdomen: Secondary | ICD-10-CM

## 2015-04-23 DIAGNOSIS — R262 Difficulty in walking, not elsewhere classified: Secondary | ICD-10-CM

## 2015-04-23 NOTE — Therapy (Signed)
[facility] [address] [place], [place], [address] Phone: [phone]   Fax:  [phone]  Physical Therapy Treatment (Discharge)  Patient Details  Name: Lindsay Howe MRN: [id number] Date of Birth: Sep 10, 1960 Referring Provider: Lendon Collar PA   Encounter Date: 04/23/2015      PT End of Session - 04/23/15 1730    Visit Number 5   Number of Visits 12   Authorization Type [employer] (visit limit 90 days)   Authorization Time Period 03/24/15 to 05/24/15   PT Start Time 1646   PT Stop Time 1727   PT Time Calculation (min) 41 min   Activity Tolerance Patient tolerated treatment well   Behavior During Therapy [facility] for tasks assessed/performed      Past Medical History  Diagnosis Date  . Thyroid disease   . Depression   . Hyperlipemia   . GERD (gastroesophageal reflux disease)   . Insomnia     Past Surgical History  Procedure Laterality Date  . Thyroidectomy, partial    . Tubal ligation    . Breast enhancement surgery    . Carpal tunnel release      left  . Colonoscopy with esophagogastroduodenoscopy (egd)  07/05/2012    Procedure: COLONOSCOPY WITH ESOPHAGOGASTRODUODENOSCOPY (EGD);  Surgeon: Rogene Houston, MD;  Location: [facility];  Service: Endoscopy;  Laterality: N/A;  1200  . Bladder repair      There were no vitals filed for this visit.  Visit Diagnosis:  Left-sided low back pain with right-sided sciatica  Poor posture  Difficulty walking  Abdominal weakness  Muscle weakness      Subjective Assessment - 04/23/15 1647    Subjective Patient reports that she currently does not have any pain but does sometimes have pain in her lateral legs when she goes up and down the ladder too much    Pertinent History Pain started in her left hip about a year ago; she was seeing Dr. Alvan Dame, who gave her injections and did MRI which showed some degenerative disc disease. She was then given a shot in her spine, which still  didn't help. She then went to Dr. Susa Day, who referred her to PT to see if this might help. They did find a cyst  on MRI and are going to follow up with a CT myelogram in 2 weeks.    How long can you sit comfortably? 10/27- 45-60 minutes    How long can you stand comfortably? 10/27- 3-4 hours    How long can you walk comfortably? 10/27- still not pushing walking distance    Patient Stated Goals Go back to walking, climb steps without grabbing railings and having to pull herself up, be able to move like she used to   Currently in Pain? No/denies            [facility] PT Assessment - 04/23/15 0001    Assessment   Referring Provider Heidi McDunnah PA    Observation/Other Assessments   Focus on Therapeutic Outcomes (FOTO)  17% limited    AROM   Right Hip Internal Rotation  38   Left Hip Internal Rotation  40   Lumbar Flexion 85   Lumbar Extension 27   Lumbar - Right Side Bend fingertips to slightly below joint line of knee    Lumbar - Left Side Bend fingers to just below jointline of knee    Strength   Overall Strength Comments core generally 4/5 today    Right  Hip Flexion 4+/5   Right Hip Extension 4/5   Right Hip ABduction 4+/5   Left Hip Flexion 5/5   Left Hip Extension 4-/5   Left Hip ABduction 4+/5   Right Knee Flexion 4+/5   Right Knee Extension 5/5   Left Knee Flexion 5/5   Left Knee Extension 5/5   Right Ankle Dorsiflexion 5/5   Left Ankle Dorsiflexion 4+/5                     [facility] Adult PT Treatment/Exercise - 04/23/15 0001    Lumbar Exercises: Stretches   Active Hamstring Stretch 3 reps;30 seconds   Active Hamstring Stretch Limitations 12 inch box    Passive Hamstring Stretch 3 reps;30 seconds   Passive Hamstring Stretch Limitations slantboard    Double Knee to Chest Stretch --   Lower Trunk Rotation --   Piriformis Stretch 3 reps;30 seconds   Piriformis Stretch Limitations seated                PT Education - 04/23/15 1729    Education  provided Yes   Education Details advanced HEP, DC today, progress with skilled PT services, squat form    Person(s) Educated Patient   Methods Explanation;Handout;Verbal cues;Demonstration   Comprehension Verbalized understanding          PT Short Term Goals - 04/23/15 1710    PT SHORT TERM GOAL #1   Title Patient will demonstrate bilateral hip IR ROM of 45 degrees in order to improve functional mechanics and reduce stress on hips and back    Time 3   Period Weeks   Status On-going   PT SHORT TERM GOAL #2   Title Patient will demonstrate reduced muscle tension and knotting in R side of lumbar area, as well as R pirformis and gluteals    Time 3   Period Weeks   Status Achieved   PT SHORT TERM GOAL #3   Title Patient to report that her pain at night has decreased to a point where she is able to sleep for at least 3 hours at a time with no pain or sleeping medications    Time 3   Period Weeks   Status Achieved   PT SHORT TERM GOAL #4   Title Patient to be independent in correctly and consistently performing appropriate HEP, to be updated PRN    Time 3   Period Weeks   Status Achieved           PT Long Term Goals - 04/23/15 1712    PT LONG TERM GOAL #1   Title Patient will demonstrate bilateral lower extremity strength 4+/5 and proximal muscle/core strength 4+/5 in order to reduce pain and allow her to perform functional tasks such as stair climbing with minimal use of UEs    Time 6   Period Weeks   Status Partially Met   PT LONG TERM GOAL #2   Title Patient will demonstrate appropriate posture during all functional standing and sitting tasks at least 90% of the time with only occasional cues for correct postural mechanics    Time 6   Period Weeks   Status On-going   PT LONG TERM GOAL #3   Title Patient to report that she has been able to sleep through the night with no pain or sleeping medications and without waking up due to pain    Baseline 10/27- can sleep 5-6 hours  without waking up; might wake up after  that but doesn't have to get up or take medicine to resolve it    Time 6   Period Weeks   Status Partially Met   PT LONG TERM GOAL #4   Title Patient to perform regular physical activity, of at least 20 minutes in duration, at least 3 times per week, in order to maintain functional gains and assist her in achieving improved health habits    Baseline 10/27- 3 days per week, walking    Time 6   Period Weeks   Status On-going   PT LONG TERM GOAL #5   Title Patient to be able to perform sitting and standing activities for at least 90 minutes with pain no more than 2/10 low back and R hip    Baseline 10/27- has met for standing but not sitting    Time 6   Period Weeks   Status Partially Met               Plan - 04/23/15 1730    Clinical Impression Statement Discharge assessment performed today. Patient is able to do everything she functionally needs and wants to do with no increase in pain, however she does state that she does become fatigued and has mild difficulty with tasks such as repeatedly climbing ladders or getting up and down from the floor. She shows significant increases in her strength as well as some increased in spinal ROM. She does continue however to demonstrate poor posture. She is very motivated to continue with her HEP to maintain her gains, and at this time is appropriate for DC to advanced HEP. No further need of skilled PT services at this time.    Pt will benefit from skilled therapeutic intervention in order to improve on the following deficits Decreased endurance;Hypomobility;Decreased activity tolerance;Decreased strength;Pain;Difficulty walking;Increased muscle spasms;Improper body mechanics;Decreased coordination;Impaired flexibility;Postural dysfunction   Rehab Potential Good   PT Frequency 2x / week   PT Duration 6 weeks   PT Treatment/Interventions ADLs/Self Care Home Management;Gait training;Functional mobility  training;Therapeutic activities;Therapeutic exercise;Balance training;Neuromuscular re-education;Patient/family education;Manual techniques   PT Next Visit Plan DC today    PT Home Exercise Plan given    Consulted and Agree with Plan of Care Patient        Problem List Patient Active Problem List   Diagnosis Date Noted  . Reflux esophagitis 10/15/2012  . Hypothyroidism 06/06/2012  . GERD (gastroesophageal reflux disease) 06/06/2012  . Dysphagia 06/06/2012    PHYSICAL THERAPY DISCHARGE SUMMARY  Visits from Start of Care: 5  Current functional level related to goals / functional outcomes: Patient is able to perform all functional tasks and activities with minimal pain but does continue to complain of some muscle fatigue with certain tasks. She has made fantastic progress with skilled PT services, is very motivated to continue with advanced HEP, and is appropriate for DC at this time.    Remaining deficits: Mild functional weakness, mild lumbar stiffness, postural deficits   Education / Equipment: Advanced HEP, corrected squat form  Plan: Patient agrees to discharge.  Patient goals were partially met. Patient is being discharged due to being pleased with the current functional level.  ?????       Deniece Ree PT, DPT [facility] [address] [place], [place], [address] Phone: [phone]   Fax:  [phone]  Name: Lindsay Howe MRN: [id number] Date of Birth: 16-Sep-1960

## 2015-04-23 NOTE — Patient Instructions (Signed)
   SCAPULAR RETRACTIONS  Draw your shoulder blades back and down. It should feel like you are squeezing your shoulder blades together.   Repeat 15 times, twice a day.    SHOULDER ROLLS  Move your shoulders in a circular pattern as shown so that your are moving in an up, back and down direction. Perform small cicles if needed for comfort.  Repeat 20 times, twice a day.      Head Retraction in Sitting:  In the seated position look straight ahead and allow herself to relax. Move your head slowly and steadily backwards until it is pulled back as far as you can manage. Keep your chin slightly tucked down and in as you retract. Maintain this position for a few seconds, relax and repeat 15 times, twice a day.     Forward Step Ups  Begin by standing with the involved leg on a step with the knee and toes pointed forward. While maintaining upright posture, press through the heel to lift your other foot onto the step and return to starting position in a slow and control motion. Repeat 10 times each leg, twice a day.  Anterior Step-Down    Stand with both feet on  step. Step down in A direction with left foot, touching heel to the floor and return _10__ times. _1__ set each leg  _2__ times per day.  http://gglj.exer.us/185   Copyright  VHI. All rights reserved.

## 2015-04-28 ENCOUNTER — Encounter (HOSPITAL_COMMUNITY): Payer: Managed Care, Other (non HMO) | Admitting: Physical Therapy

## 2015-06-30 ENCOUNTER — Ambulatory Visit (INDEPENDENT_AMBULATORY_CARE_PROVIDER_SITE_OTHER): Payer: 59 | Admitting: Neurology

## 2015-06-30 ENCOUNTER — Encounter: Payer: Self-pay | Admitting: Neurology

## 2015-06-30 VITALS — BP 122/74 | HR 78 | Resp 16 | Ht 61.5 in | Wt 159.0 lb

## 2015-06-30 DIAGNOSIS — G4761 Periodic limb movement disorder: Secondary | ICD-10-CM | POA: Diagnosis not present

## 2015-06-30 DIAGNOSIS — G4733 Obstructive sleep apnea (adult) (pediatric): Secondary | ICD-10-CM | POA: Diagnosis not present

## 2015-06-30 DIAGNOSIS — R634 Abnormal weight loss: Secondary | ICD-10-CM | POA: Diagnosis not present

## 2015-06-30 DIAGNOSIS — Z9989 Dependence on other enabling machines and devices: Principal | ICD-10-CM

## 2015-06-30 NOTE — Patient Instructions (Signed)
Please continue using your CPAP regularly. While your insurance requires that you use CPAP at least 4 hours each night on 70% of the nights, I recommend, that you not skip any nights and use it throughout the night if you can. Getting used to CPAP and staying with the treatment long term does take time and patience and discipline. Untreated obstructive sleep apnea when it is moderate to severe can have an adverse impact on cardiovascular health and raise her risk for heart disease, arrhythmias, hypertension, congestive heart failure, stroke and diabetes. Untreated obstructive sleep apnea causes sleep disruption, nonrestorative sleep, and sleep deprivation. This can have an impact on your day to day functioning and cause daytime sleepiness and impairment of cognitive function, memory loss, mood disturbance, and problems focussing. Using CPAP regularly can improve these symptoms.  Keep up the good work! I will see you back in 6 months for sleep apnea check up, and if you continue to do well with your weight loss, we will bring you back for another diagnostic sleep study to see if we can phase you out of CPAP.

## 2015-06-30 NOTE — Progress Notes (Signed)
Subjective:    Patient ID: Lindsay Howe is a 55 y.o. female.  HPI     Interim history:   Lindsay Howe is a very pleasant 55 year old right-handed woman with an underlying medical history of reflux disease, depression, hypothyroidism, migraine headaches and obesity, who presents for follow-up consultation of her obstructive sleep apnea, after her sleep studies. The patient is unaccompanied today. I first met her on 03/03/2015 at the request of Dr. Brien Few, at which time the patient reported snoring and excessive daytime somnolence. I invited her back for sleep study. She had a baseline sleep study, followed by a CPAP titration study. I went over her test results with her in detail today. Her baseline sleep study from 03/24/2015 showed a sleep efficiency of 61.1% with a prolonged sleep latency of 82 minutes and wake after sleep onset of 97.5 minutes with severe sleep fragmentation noted. She had an elevated arousal index. She had an elevated percentage of light stage sleep, decreased percentage of slow-wave sleep and a decreased percentage of REM sleep with a markedly prolonged REM latency of 294.5 minutes. She had mild PLMS with an index of 22.5 per hour, resulting in only 3.4 arousals per hour. She had no significant EKG or EEG changes. She had mild to moderate snoring. Total AHI was 17.6 per hour. Average oxygen saturation was 93%, nadir was 88%. Based on her sleep-related complaints and her test results I invited her back for a CPAP titration study. She had this on 04/14/2015. Sleep efficiency was 79.4% with a latency to sleep of 65 minutes and wake after sleep onset of 34 minutes with mild to moderate sleep fragmentation noted. She has a mildly elevated arousal index. She had an increased percentage of stage II sleep, absence of slow-wave sleep and a mildly decreased percentage of REM sleep with a prolonged REM latency. She had mild PLMS with an index of 17.6 per hour, resulting in 5 arousals per  hour. She had no significant EKG or EEG changes. CPAP was titrated from 5 cm to 8 cm. AHI was 0 per hour at the final pressure with supine REM sleep achieved. Based on her test results I prescribed CPAP therapy for home use.  Today, 06/30/2015: I reviewed her CPAP compliance data from 05/26/2015 through 06/24/2015 which is a total of 30 days during which time she used her machine only 14 days with percent used days greater than 4 hours at 47%, indicating suboptimal compliance with an average usage of 3 hours and 26 minutes, residual AHI low at 0.5 per hour, leak low with the 95th percentile at 3.9 L/m on a pressure of 8 cm with EPR of 3.  Today, 06/30/2015: She reports doing fairly well. She has been able to lose weight. In fact, since her original sleep study in September 2016 she has lost 30 pounds. She does not notice much difference between using CPAP and without using CPAP at this time but in the beginning, she does admit sleeping better after being placed on CPAP therapy. She is striving for an additional 20 pound weight loss and is going to start exercising as well. She goes to a weight loss clinic. She denies any significant RLS symptoms at this time and her husband has not noticed much in the way of leg twitching when she is asleep.  Previously:  03/03/2015: She reports snoring and excessive daytime somnolence. Snoring is reported to be generally speaking mild but she has woken herself up with a sense of gasping on  a rare occasion. She denies morning headaches but has nocturia about once per night. She is retired. 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 She has a bedtime of around 9:30 PM and likes to read in bed. She does not watch TV in bed. She has tried to improve her sleep hygiene. She limits caffeine in the form of coffee to 3 cups in the morning only. Her husband used to snore but he has a dental appliance and his sleep does not bother her. She is a restless sleeper and admits to  tossing and turning a lot. She has tried medications to help with sleep onset difficulties. She had side effects including parasomnias on Ambien. She had side effects with temazepam, primarily next a drowsiness. She has been trying Belsomra for the past few weeks. She takes it at 8 or 8:30 PM. Sleep onset is after about half an hour when she takes the medication. She takes it about twice per week on average. She has recently started hormone replacement therapy about 6 weeks ago including a strict and patch and progesterone pills. She has gained weight in the last couple of years. Her rise time is around 6 AM as she helps with childcare for her grandson. She goes to her daughter's house for this. She does not take naps as she is worried that she will not be able to sleep at night. She feels sleepy during the day. Her Epworth sleepiness score is 13 out of a maximum of 24 today. Her fatigue score is 56 out of 63. She denies frank restless legs symptoms. She has no family history of obstructive sleep apnea. She drinks alcohol occasionally on weekends. She quit smoking in 2012. She would be willing to try a CPAP if needed.  I reviewed your office note from 01/08/2015, which you kindly included. She had an overnight pulse oximetry test through your office on 01/21/2015, which I reviewed: Total valid sampling time was 8 hours and 1 minute, average oxygen saturation 92.3%, nadir of 78%. Time below 89% saturation was 5 minutes and 46 seconds.  Her Past Medical History Is Significant For: Past Medical History  Diagnosis Date  . Thyroid disease   . Depression   . Hyperlipemia   . GERD (gastroesophageal reflux disease)   . Insomnia     Her Past Surgical History Is Significant For: Past Surgical History  Procedure Laterality Date  . Thyroidectomy, partial    . Tubal ligation    . Breast enhancement surgery    . Carpal tunnel release      left  . Colonoscopy with esophagogastroduodenoscopy (egd)  07/05/2012     Procedure: COLONOSCOPY WITH ESOPHAGOGASTRODUODENOSCOPY (EGD);  Surgeon: Rogene Houston, MD;  Location: [facility];  Service: Endoscopy;  Laterality: N/A;  1200  . Bladder repair      Her Family History Is Significant For: Family History  Problem Relation Age of Onset  . Hypothyroidism Mother   . Hypertension Mother   . Hyperlipidemia Mother   . Osteoporosis Mother   . Colon cancer Maternal Grandmother   . Stroke Maternal Grandfather     Her Social History Is Significant For: Social History   Social History  . Marital Status: Married    Spouse Name: Marcello Moores  . Number of Children: 2  . Years of Education: Associates   Occupational History  . Retired    Social History Main Topics  . Smoking status: Former Smoker -- 1.00 packs/day for 15  years    Types: Cigarettes    Quit date: 03/28/2011  . Smokeless tobacco: Never Used  . Alcohol Use: 1.8 oz/week    3 Glasses of wine per week  . Drug Use: No  . Sexual Activity: Yes    Birth Control/ Protection: Surgical   Other Topics Concern  . None   Social History Narrative   Drinks about 3 cups of coffee a day     Her Allergies Are:  No Known Allergies:   Her Current Medications Are:  Outpatient Encounter Prescriptions as of 06/30/2015  Medication Sig  . amitriptyline (ELAVIL) 50 MG tablet Take 50 mg by mouth at bedtime.  . Biotin 7500 MCG TABS Take by mouth.  . cholecalciferol (VITAMIN D) 1000 units tablet Take 2,000 Units by mouth daily.  Marland Kitchen dexlansoprazole (DEXILANT) 60 MG capsule   . estradiol (CLIMARA - DOSED IN MG/24 HR) 0.05 mg/24hr patch   . ibuprofen (ADVIL,MOTRIN) 200 MG tablet Take 200 mg by mouth every 6 (six) hours as needed.  Marland Kitchen levothyroxine (SYNTHROID, LEVOTHROID) 100 MCG tablet   . Nutritional Supplements (DHEA PO) Take by mouth.  . Omega-3 Fatty Acids (FISH OIL) 1000 MG CAPS Take by mouth.  . progesterone (PROMETRIUM) 200 MG capsule   . spironolactone (ALDACTONE) 25 MG tablet Take 25 mg by mouth  daily.  . [DISCONTINUED] co-enzyme Q-10 30 MG capsule Take 30 mg by mouth 3 (three) times daily.  . [DISCONTINUED] Collagen 500 MG CAPS Take by mouth.  . [DISCONTINUED] estradiol (CLIMARA - DOSED IN MG/24 HR) 0.0375 mg/24hr patch   . [DISCONTINUED] Rose Oil OIL by Does not apply route.  . [DISCONTINUED] Suvorexant (BELSOMRA) 10 MG TABS    No facility-administered encounter medications on file as of 06/30/2015.  :  Review of Systems:  Out of a complete 14 point review of systems, all are reviewed and negative with the exception of these symptoms as listed below:  Review of Systems  Neurological:       Patient reports that she has lost 29lbs since the sleep study and wonders if she still needs to use her CPAP machine.     Objective:  Neurologic Exam  Physical Exam Physical Examination:   Filed Vitals:   06/30/15 0851  BP: 122/74  Pulse: 78  Resp: 16    General Examination: The patient is a very pleasant 55 y.o. female in no acute distress. She appears well-developed and well-nourished and well groomed. She is in good spirits today.  HEENT: Normocephalic, atraumatic, pupils are equal, round and reactive to light and accommodation. Funduscopic exam is normal with sharp disc margins noted. Extraocular tracking is good without limitation to gaze excursion or nystagmus noted. Normal smooth pursuit is noted. Hearing is grossly intact. Face is symmetric with normal facial animation and normal facial sensation. Speech is clear with no dysarthria noted. There is no hypophonia. There is no lip, neck/head, jaw or voice tremor. Neck is supple with full range of passive and active motion. There are no carotid bruits on auscultation. Oropharynx exam reveals: mild mouth dryness, good dental hygiene and moderate airway crowding, due to narrow airway entry, tonsils of 2+ bilaterally, redundant soft palate with relatively larger appearing uvula. Mallampati is class II. Tongue protrudes centrally and  palate elevates symmetrically. She has a very mild overbite. Nasal inspection reveals no significant nasal mucosal bogginess or redness and no septal deviation.   Chest: Clear to auscultation without wheezing, rhonchi or crackles noted.  Heart: S1+S2+0, regular and normal  without murmurs, rubs or gallops noted.   Abdomen: Soft, non-tender and non-distended with normal bowel sounds appreciated on auscultation.  Extremities: There is no pitting edema in the distal lower extremities bilaterally. Pedal pulses are intact.  Skin: Warm and dry without trophic changes noted. There are no varicose veins.  Musculoskeletal: exam reveals no obvious joint deformities, tenderness or joint swelling or erythema.   Neurologically:  Mental status: The patient is awake, alert and oriented in all 4 spheres. Her immediate and remote memory, attention, language skills and fund of knowledge are appropriate. There is no evidence of aphasia, agnosia, apraxia or anomia. Speech is clear with normal prosody and enunciation. Thought process is linear. Mood is normal and affect is normal.  Cranial nerves II - XII are as described above under HEENT exam. In addition: shoulder shrug is normal with equal shoulder height noted. Motor exam: Normal bulk, strength and tone is noted. There is no drift, tremor or rebound. Romberg is negative. Reflexes are 2+ throughout. Fine motor skills and coordination: intact with normal finger taps, normal hand movements, normal rapid alternating patting, normal foot taps and normal foot agility.  Cerebellar testing: No dysmetria or intention tremor on finger to nose testing. Heel to shin is unremarkable bilaterally. There is no truncal or gait ataxia.  Sensory exam: intact to light touch in the upper and lower extremities.  Gait, station and balance: She stands easily. No veering to one side is noted. No leaning to one side is noted. Posture is age-appropriate and stance is narrow based. Gait  shows normal stride length and normal pace. No problems turning are noted. She turns en bloc. Tandem walk is unremarkable.  Assessment and Plan:   In summary, LASHANN HAGG is a very pleasant 55 year old female with an underlying medical history of reflux disease, depression, hypothyroidism, migraine headaches and obesity, who presents for follow-up consultation of her obstructive sleep apnea. Her baseline sleep study from September 2016 showed moderate OSA based on her AHI. She was placed on CPAP therapy after her second sleep study in October 2016. She is mediocre with her compliance but has also lost weight in the interim for which she is congratulated. She is advised about CPAP compliance and we talked about her test results in detail today as well as her compliance data. Her physical exam is stable with the exception of notable weight loss. She is planning on losing another 20 pounds. She is advised to follow-up in 6 months and we will consider doing another baseline sleep study at the time to see if she still needs to be treated with CPAP. In the interim, she is advised to stay compliant with treatment.  She denies any significant RLS symptoms at this time. She had mild PLMS during both her studies. We will monitor her symptoms and if she has significant RLS type symptoms people considered symptomatic treatment for this. I would like to see her back in 6 months, sooner if needed. I answered all her questions today and she was in agreement with the plan. I spent 20 minutes in total face-to-face time with the patient, more than 50% of which was spent in counseling and coordination of care, reviewing test results, reviewing medication and discussing or reviewing the diagnosis of OSA, its prognosis and treatment options.

## 2016-01-04 ENCOUNTER — Telehealth: Payer: Self-pay

## 2016-01-04 NOTE — Telephone Encounter (Signed)
I called patient to bring in SD card to appt tomorrow. Patient states that she has not used her CPAP in months and has turned it back in to DME company.

## 2016-01-05 ENCOUNTER — Encounter: Payer: Self-pay | Admitting: Neurology

## 2016-01-05 ENCOUNTER — Ambulatory Visit (INDEPENDENT_AMBULATORY_CARE_PROVIDER_SITE_OTHER): Payer: 59 | Admitting: Neurology

## 2016-01-05 VITALS — BP 128/72 | HR 72 | Resp 16 | Ht 61.5 in | Wt 143.0 lb

## 2016-01-05 DIAGNOSIS — G4733 Obstructive sleep apnea (adult) (pediatric): Secondary | ICD-10-CM

## 2016-01-05 DIAGNOSIS — G4700 Insomnia, unspecified: Secondary | ICD-10-CM

## 2016-01-05 DIAGNOSIS — G4761 Periodic limb movement disorder: Secondary | ICD-10-CM | POA: Diagnosis not present

## 2016-01-05 NOTE — Progress Notes (Signed)
Subjective:    Patient ID: Lindsay Howe is a 55 y.o. female.  HPI     Interim history:   Lindsay Howe is a very pleasant 55 year old right-handed woman with an underlying medical history of reflux disease, depression, hypothyroidism, migraine headaches and obesity, who presents for follow-up consultation of her obstructive sleep apnea, on CPAP therapy at home. The patient is unaccompanied today. I last saw her on 06/30/2015, at which time she was not fully compliant with CPAP therapy. We talked about her sleep study results from her baseline sleep study from 03/24/2015 as well as her CPAP titration study from 04/14/2015 in detail last time. She was encouraged to be fully compliant with treatment. She had lost some weight. Compared to her baseline sleep study from September 2016 she had indeed lost about 30 pounds. She was striving for an additional 20 pound weight loss. She was going to weight loss clinic. She denied any significant RLS symptoms and we agreed to monitor for RLS and PLMS. Her husband had not noticed any significant leg twitching while she was asleep.  Today, 01/05/2016: She reports having more issues with sleep maintenance, may wake up after about 3 hours. She is taking Belsomra as needed. She was given an Rx for Xanax for anxiety and has taken it on maybe 3 occasions in the middle of the night to help her go back to sleep, which did help some. Belsomra Causes her a hangover feeling and she uses it infrequently. Over the course of time, she has tried Ambien, which cause side effects, temazepam caused her to have residual drowsiness the next day at 15 mg, triazolam low does cause her to feel hung over, tryptophan did not help, Benadryl did not help, amitriptyline either did not help or cause side effects as she recalls and 50 mg, trazodone at 50 mg did not help either.  Previously:  I first met her on 03/03/2015 at the request of Dr. Brien Few, at which time the patient reported  snoring and excessive daytime somnolence. I invited her back for sleep study. She had a baseline sleep study, followed by a CPAP titration study. I went over her test results with her in detail today. Her baseline sleep study from 03/24/2015 showed a sleep efficiency of 61.1% with a prolonged sleep latency of 82 minutes and wake after sleep onset of 97.5 minutes with severe sleep fragmentation noted. She had an elevated arousal index. She had an elevated percentage of light stage sleep, decreased percentage of slow-wave sleep and a decreased percentage of REM sleep with a markedly prolonged REM latency of 294.5 minutes. She had mild PLMS with an index of 22.5 per hour, resulting in only 3.4 arousals per hour. She had no significant EKG or EEG changes. She had mild to moderate snoring. Total AHI was 17.6 per hour. Average oxygen saturation was 93%, nadir was 88%. Based on her sleep-related complaints and her test results I invited her back for a CPAP titration study. She had this on 04/14/2015. Sleep efficiency was 79.4% with a latency to sleep of 65 minutes and wake after sleep onset of 34 minutes with mild to moderate sleep fragmentation noted. She has a mildly elevated arousal index. She had an increased percentage of stage II sleep, absence of slow-wave sleep and a mildly decreased percentage of REM sleep with a prolonged REM latency. She had mild PLMS with an index of 17.6 per hour, resulting in 5 arousals per hour. She had no significant EKG or EEG  changes. CPAP was titrated from 5 cm to 8 cm. AHI was 0 per hour at the final pressure with supine REM sleep achieved. Based on her test results I prescribed CPAP therapy for home use.  I reviewed her CPAP compliance data from 05/26/2015 through 06/24/2015 which is a total of 30 days during which time she used her machine only 14 days with percent used days greater than 4 hours at 47%, indicating suboptimal compliance with an average usage of 3 hours and 26  minutes, residual AHI low at 0.5 per hour, leak low with the 95th percentile at 3.9 L/m on a pressure of 8 cm with EPR of 3.  03/03/2015: She reports snoring and excessive daytime somnolence. Snoring is reported to be generally speaking mild but she has woken herself up with a sense of gasping on a rare occasion. She denies morning headaches but has nocturia about once per night. She is retired. 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 She has a bedtime of around 9:30 PM and likes to read in bed. She does not watch TV in bed. She has tried to improve her sleep hygiene. She limits caffeine in the form of coffee to 3 cups in the morning only. Her husband used to snore but he has a dental appliance and his sleep does not bother her. She is a restless sleeper and admits to tossing and turning a lot. She has tried medications to help with sleep onset difficulties. She had side effects including parasomnias on Ambien. She had side effects with temazepam, primarily next a drowsiness. She has been trying Belsomra for the past few weeks. She takes it at 8 or 8:30 PM. Sleep onset is after about half an hour when she takes the medication. She takes it about twice per week on average. She has recently started hormone replacement therapy about 6 weeks ago including a strict and patch and progesterone pills. She has gained weight in the last couple of years. Her rise time is around 6 AM as she helps with childcare for her grandson. She goes to her daughter's house for this. She does not take naps as she is worried that she will not be able to sleep at night. She feels sleepy during the day. Her Epworth sleepiness score is 13 out of a maximum of 24 today. Her fatigue score is 56 out of 63. She denies frank restless legs symptoms. She has no family history of obstructive sleep apnea. She drinks alcohol occasionally on weekends. She quit smoking in 2012. She would be willing to try a CPAP if needed.  I reviewed your office  note from 01/08/2015, which you kindly included. She had an overnight pulse oximetry test through your office on 01/21/2015, which I reviewed: Total valid sampling time was 8 hours and 1 minute, average oxygen saturation 92.3%, nadir of 78%. Time below 89% saturation was 5 minutes and 46 seconds.   Her Past Medical History Is Significant For: Past Medical History  Diagnosis Date  . Thyroid disease   . Depression   . Hyperlipemia   . GERD (gastroesophageal reflux disease)   . Insomnia     Her Past Surgical History Is Significant For: Past Surgical History  Procedure Laterality Date  . Thyroidectomy, partial    . Tubal ligation    . Breast enhancement surgery    . Carpal tunnel release      left  . Colonoscopy with esophagogastroduodenoscopy (egd)  07/05/2012  Procedure: COLONOSCOPY WITH ESOPHAGOGASTRODUODENOSCOPY (EGD);  Surgeon: Rogene Houston, MD;  Location: [facility];  Service: Endoscopy;  Laterality: N/A;  1200  . Bladder repair      Her Family History Is Significant For: Family History  Problem Relation Age of Onset  . Hypothyroidism Mother   . Hypertension Mother   . Hyperlipidemia Mother   . Osteoporosis Mother   . Colon cancer Maternal Grandmother   . Stroke Maternal Grandfather     Her Social History Is Significant For: Social History   Social History  . Marital Status: Married    Spouse Name: Marcello Moores  . Number of Children: 2  . Years of Education: Associates   Occupational History  . Retired    Social History Main Topics  . Smoking status: Former Smoker -- 1.00 packs/day for 15 years    Types: Cigarettes    Quit date: 03/28/2011  . Smokeless tobacco: Never Used  . Alcohol Use: 1.8 oz/week    3 Glasses of wine per week  . Drug Use: No  . Sexual Activity: Yes    Birth Control/ Protection: Surgical   Other Topics Concern  . None   Social History Narrative   Drinks about 3 cups of coffee a day     Her Allergies Are:  No Known Allergies:    Her Current Medications Are:  Outpatient Encounter Prescriptions as of 01/05/2016  Medication Sig  . ALPRAZolam (XANAX) 0.25 MG tablet   . BELSOMRA 10 MG TABS   . Biotin 7500 MCG TABS Take by mouth.  . dexlansoprazole (DEXILANT) 60 MG capsule   . dicyclomine (BENTYL) 10 MG/5ML syrup Take by mouth 4 (four) times daily -  before meals and at bedtime.  Marland Kitchen ESTRADIOL VAL-TESTOSTER ENAN IM Inject into the muscle.  . ibuprofen (ADVIL,MOTRIN) 200 MG tablet Take 200 mg by mouth every 6 (six) hours as needed.  Marland Kitchen levothyroxine (SYNTHROID, LEVOTHROID) 100 MCG tablet   . levothyroxine (SYNTHROID, LEVOTHROID) 25 MCG tablet   . Nutritional Supplements (DHEA PO) Take by mouth.  . Omega-3 Fatty Acids (FISH OIL) 1000 MG CAPS Take by mouth.  . progesterone (PROMETRIUM) 200 MG capsule   . spironolactone (ALDACTONE) 25 MG tablet Take 25 mg by mouth daily.  . [DISCONTINUED] amitriptyline (ELAVIL) 50 MG tablet Take 50 mg by mouth at bedtime.  . [DISCONTINUED] cholecalciferol (VITAMIN D) 1000 units tablet Take 2,000 Units by mouth daily.  . [DISCONTINUED] estradiol (CLIMARA - DOSED IN MG/24 HR) 0.05 mg/24hr patch    No facility-administered encounter medications on file as of 01/05/2016.  :  Review of Systems:  Out of a complete 14 point review of systems, all are reviewed and negative with the exception of these symptoms as listed below:   Review of Systems  Neurological:       Patient feels that she has an increase in trouble sleeping. Reports sleeping maybe 1 good night a week.     Objective:  Neurologic Exam  Physical Exam Physical Examination:   Filed Vitals:   01/05/16 0924  BP: 128/72  Pulse: 72  Resp: 16    General Examination: The patient is a very pleasant 55 y.o. female in no acute distress. She appears well-developed and well-nourished and well groomed. She is in good spirits today. She has indeed lost weight.  HEENT: Normocephalic, atraumatic, pupils are equal, round and reactive  to light and accommodation. Extraocular tracking is good without limitation to gaze excursion or nystagmus noted. Normal smooth pursuit is  noted. Hearing is grossly intact. Face is symmetric with normal facial animation and normal facial sensation. Speech is clear with no dysarthria noted. There is no hypophonia. There is no lip, neck/head, jaw or voice tremor. Neck is supple with full range of passive and active motion. There are no carotid bruits on auscultation. Oropharynx exam reveals: mild mouth dryness, good dental hygiene and moderate airway crowding, due to narrow airway entry, tonsils of 2+ bilaterally, redundant soft palate with relatively larger appearing uvula. Mallampati is class II. Tongue protrudes centrally and palate elevates symmetrically.   Chest: Clear to auscultation without wheezing, rhonchi or crackles noted.  Heart: S1+S2+0, regular and normal without murmurs, rubs or gallops noted.   Abdomen: Soft, non-tender and non-distended with normal bowel sounds appreciated on auscultation.  Extremities: There is no pitting edema in the distal lower extremities bilaterally. Pedal pulses are intact.  Skin: Warm and dry without trophic changes noted. There are no varicose veins.  Musculoskeletal: exam reveals no obvious joint deformities, tenderness or joint swelling or erythema.   Neurologically:  Mental status: The patient is awake, alert and oriented in all 4 spheres. Her immediate and remote memory, attention, language skills and fund of knowledge are appropriate. There is no evidence of aphasia, agnosia, apraxia or anomia. Speech is clear with normal prosody and enunciation. Thought process is linear. Mood is normal and affect is normal.  Cranial nerves II - XII are as described above under HEENT exam. In addition: shoulder shrug is normal with equal shoulder height noted. Motor exam: Normal bulk, strength and tone is noted. There is no drift, tremor or rebound. Romberg is negative.  Reflexes are 2+ throughout. Fine motor skills and coordination: intact in the UEs and LEs.   Cerebellar testing: No dysmetria or intention tremor on finger to nose testing. Heel to shin is unremarkable bilaterally. There is no truncal or gait ataxia.  Sensory exam: intact to light touch in the upper and lower extremities.  Gait, station and balance: She stands easily. No veering to one side is noted. No leaning to one side is noted. Posture is age-appropriate and stance is narrow based. Gait shows normal stride length and normal pace. No problems turning are noted. Tandem walk is unremarkable.  Assessment and Plan:   In summary, TANEASHA FUQUA is a very pleasant 55 year old female with an underlying medical history of reflux disease, depression, hypothyroidism, migraine headaches and obesity, who presents for follow-up consultation of her obstructive sleep apnea. Her baseline sleep study from September 2016 showed moderate OSA based on her AHI. She was placed on CPAP therapy after her second sleep study in October 2016 But was never fully compliant with treatment, however was working on weight loss and has indeed been able to lose nearly 50 pounds since September 2016. She is congratulated on her weight loss endeavor. She continues to take care of her 26-year-old grandson during the week. She has him from 6:30 AM to 6:30 PM usually during the week, another baby boy is on the way as well. For her sleep maintenance issues, she is encouraged to talk to her primary care physician about seeing a psychiatrist. She has tried multiple medications. She would be willing to consider a referral to psychiatry next. From my end of things, I suggested we repeat a sleep study to see if she has residual sleep apnea or leg twitching. She stopped amitriptyline since her first sleep study, PLMS may be improved as well, she denies any significant restless leg  symptoms at this time. I will see her back after her next sleep study.  Physical exam is stable and nonfocal, with the exception of weight loss. I spent 25 minutes in total face-to-face time with the patient, more than 50% of which was spent in counseling and coordination of care, reviewing test results, reviewing medication and discussing or reviewing the diagnosis of OSA, its prognosis and treatment options.

## 2016-01-05 NOTE — Patient Instructions (Signed)
We will repeat your sleep study to look if you have residual OSA and PLMs.   Talk to Dr. Sherwood GamblerFusco about seeing a psychiatrist for persistent sleep maintenance issues.

## 2016-10-18 ENCOUNTER — Telehealth: Payer: Self-pay | Admitting: Neurology

## 2016-10-18 NOTE — Telephone Encounter (Signed)
Sleep study was ordered for the patient however the patient would not scheduled due to the cost and unable to afford.

## 2016-12-17 IMAGING — MR MR LUMBAR SPINE W/O CM
4 of 5 series · 14 of 48 positions shown · non-contrast
Comparison: 10/01/2007

CLINICAL DATA: Low back pain and right lower extremity
radiculopathy.

EXAM:
MRI LUMBAR SPINE WITHOUT CONTRAST
TECHNIQUE: Multiplanar, multisequence MR imaging of the lumbar spine was
performed. No intravenous contrast was administered.

[Series 3: T2 · sagittal · 4.0mm · 0.63mm/px · 4 of 13 slices shown (1 of 2)]
[im 1/13]
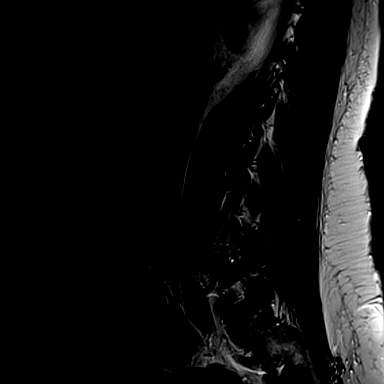
[im 5/13]
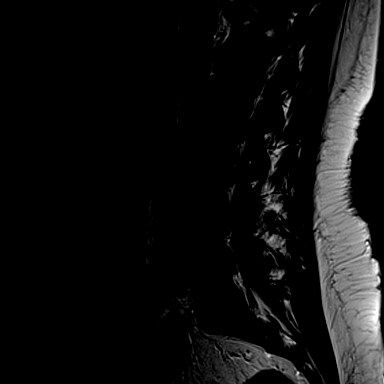
[im 9/13]
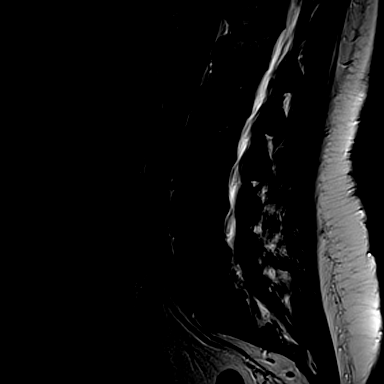
[im 13/13]
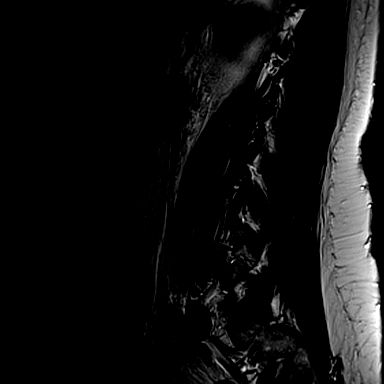

[Series 4: T1 · sagittal · 4.0mm · 0.31mm/px · 3 of 13 slices shown (1 of 2)]
[im 1/13]
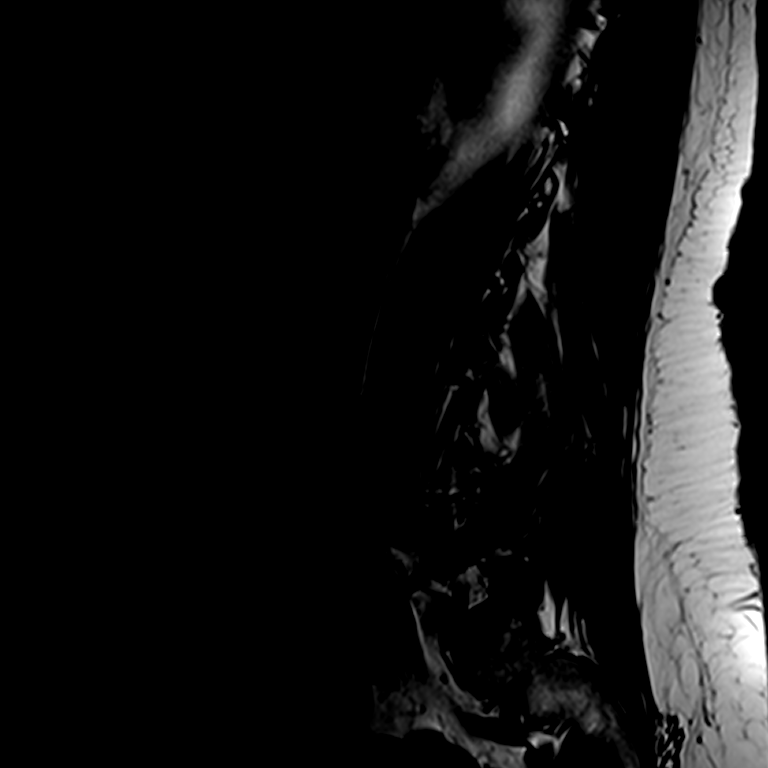
[im 7/13]
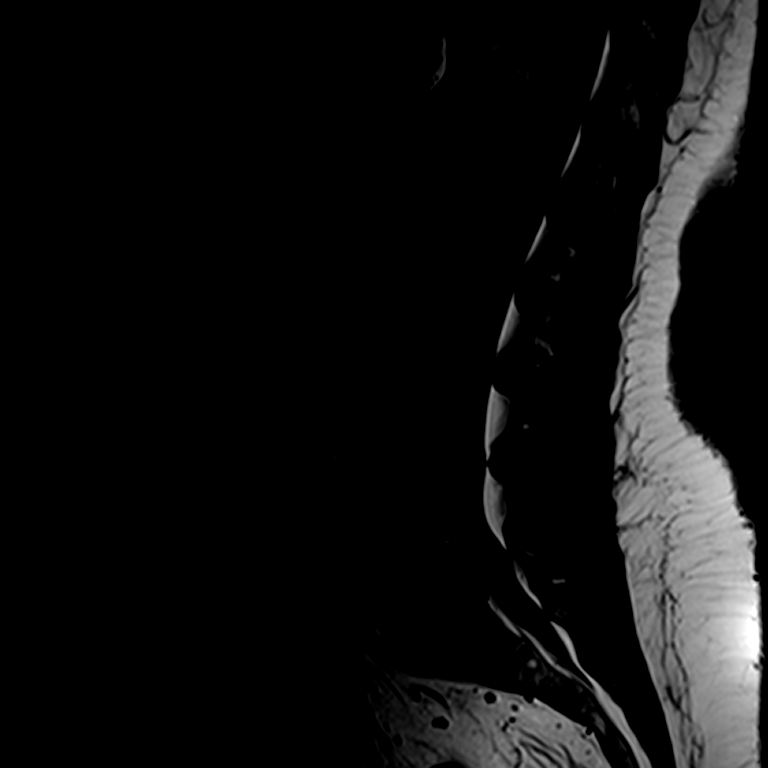
[im 13/13]
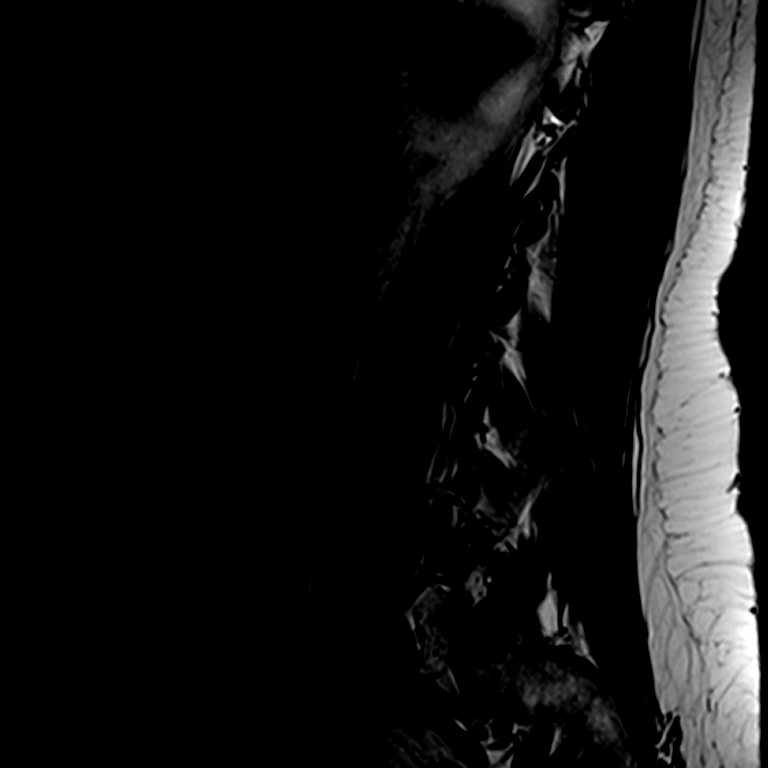

[Series 6: T2 · axial · 4.0mm · 0.26mm/px · z∈[-122,+77]mm · 4 of 45 slices shown (2 of 2)]
[im 3/45]
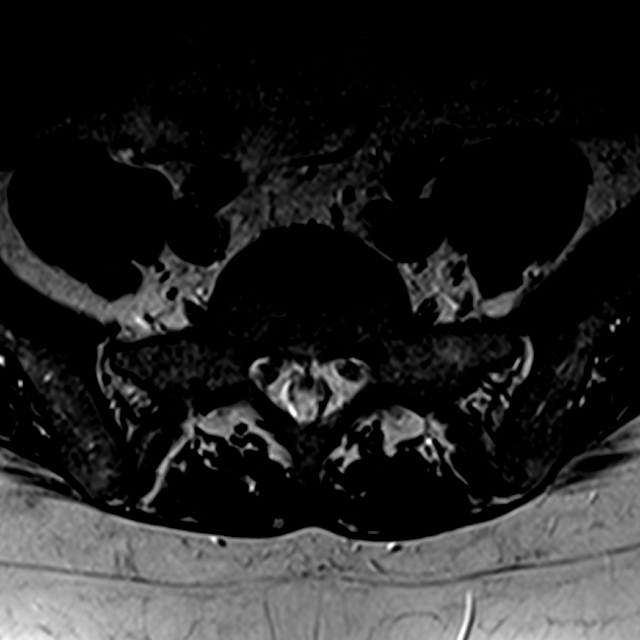
[im 6/45]
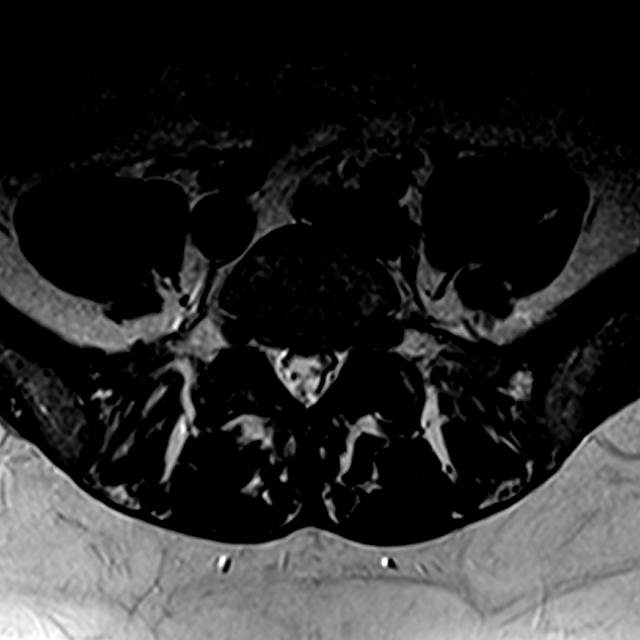
[im 23/45]
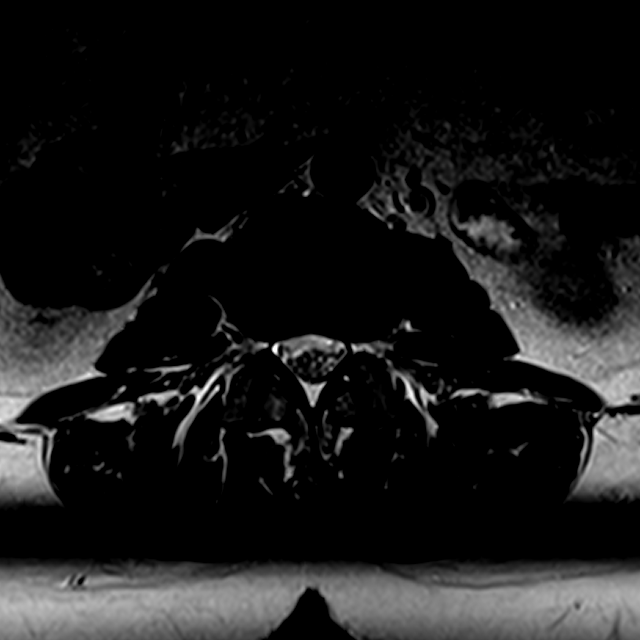
[im 39/45]
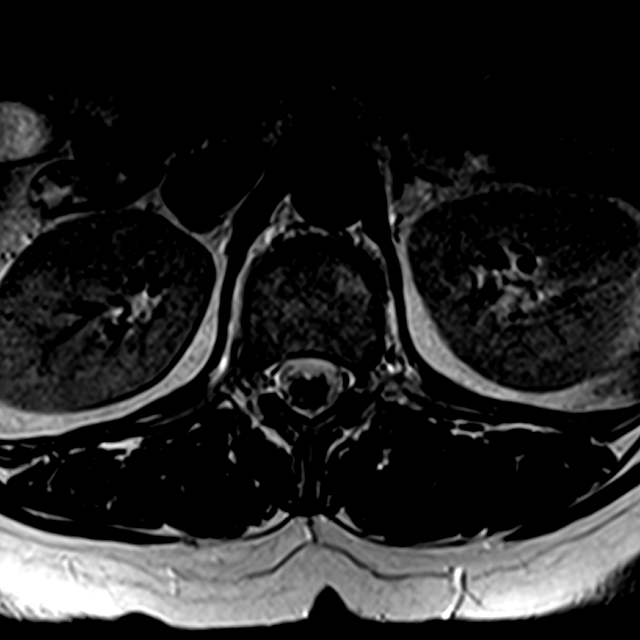

[Series 7: T1 · axial · 4.0mm · 0.26mm/px · z∈[-109,+77]mm · 3 of 45 slices shown (2 of 2)]
[im 6/45]
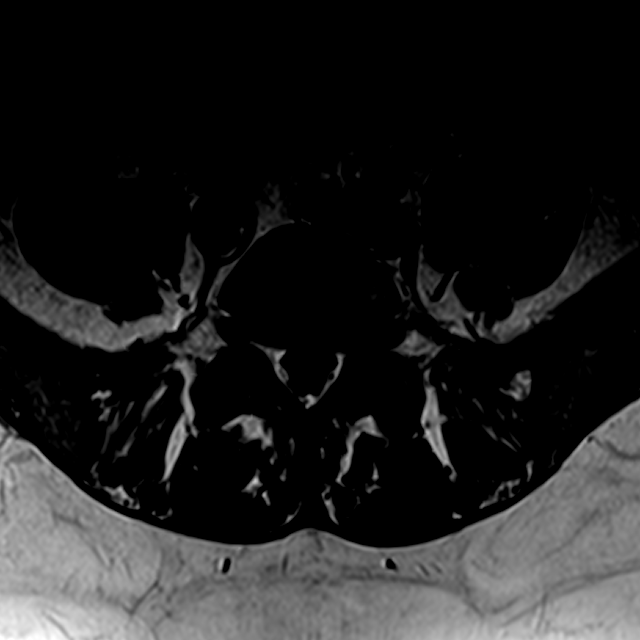
[im 23/45]
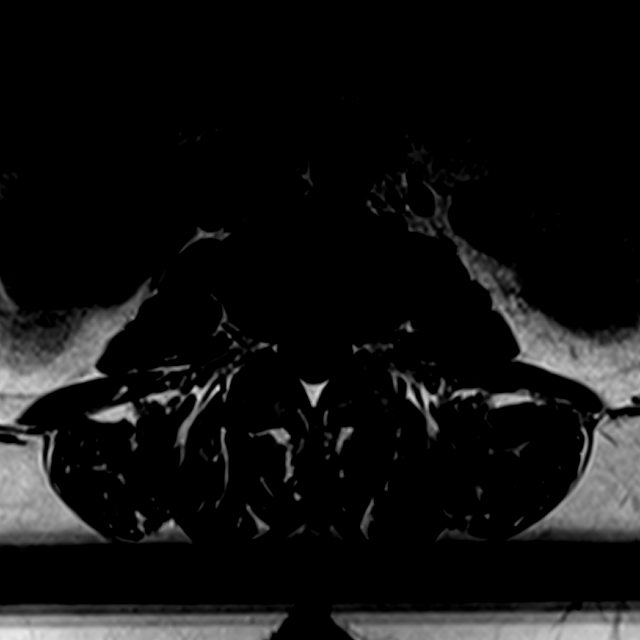
[im 39/45]
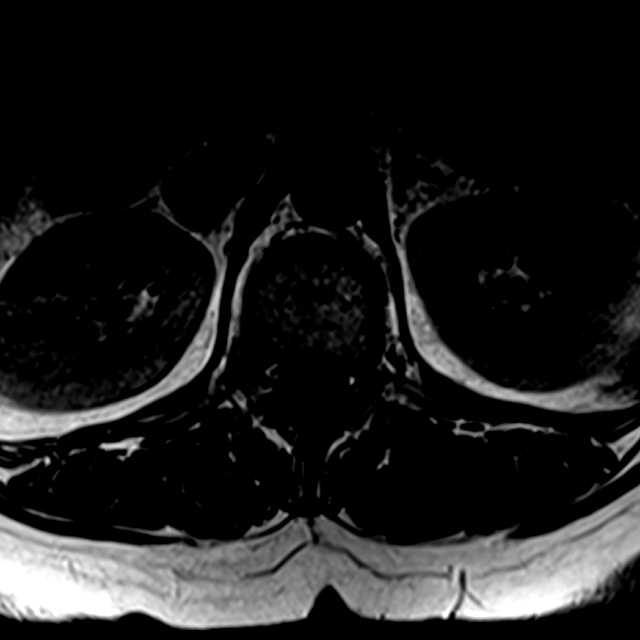

[14 of 48 positions shown; findings below may reference images not displayed]

FINDINGS: Normal conus tip at L1-2.  Normal paraspinal soft tissues.

T11-12 through L3-4: Minimal disc bulges at each level with no
neural impingement. No facet arthritis.

L4-5: The patient has developed new severe right facet arthritis
with an intraosseous ganglion cyst in the right pedicle of L5 with
edema in the pedicle appear in there is inflammation in the soft
tissues around the right facet joint. Slight left facet arthritis.
There is a small broad-based disc bulge without neural impingement.

L5-S1: New small broad-based disc bulge with slight left foraminal
stenosis. The bulge is most prominent into the left neural foramen.
Minimal bilateral facet arthritis.
IMPRESSION: 1. New severe right facet arthritis at L4-5 with an intraosseous
ganglion cyst in the right pedicle with edema in around the right
facet joint.
2. No disc protrusions or focal neural impingement.
3. Slight left foraminal stenosis at L5-S1.

## 2017-09-19 ENCOUNTER — Telehealth (HOSPITAL_COMMUNITY): Payer: Self-pay

## 2017-09-19 NOTE — Telephone Encounter (Signed)
L/m requested pt to call make to change apptment to Wed 4/2 b/c LE will be leaving ealry on 09/28/17. NF 09/19/17

## 2017-09-20 ENCOUNTER — Telehealth (HOSPITAL_COMMUNITY): Payer: Self-pay | Admitting: Specialist

## 2017-09-20 NOTE — Telephone Encounter (Signed)
Pt and MD agree that it's ok for patient to start OT on 09/29/2017. NF 09/20/2017

## 2017-09-28 ENCOUNTER — Ambulatory Visit (HOSPITAL_COMMUNITY): Payer: 59

## 2017-09-29 ENCOUNTER — Other Ambulatory Visit: Payer: Self-pay

## 2017-09-29 ENCOUNTER — Ambulatory Visit (HOSPITAL_COMMUNITY): Payer: 59 | Attending: Orthopedic Surgery | Admitting: Specialist

## 2017-09-29 DIAGNOSIS — M25512 Pain in left shoulder: Secondary | ICD-10-CM | POA: Diagnosis not present

## 2017-09-29 DIAGNOSIS — R29898 Other symptoms and signs involving the musculoskeletal system: Secondary | ICD-10-CM | POA: Diagnosis present

## 2017-09-29 DIAGNOSIS — M25612 Stiffness of left shoulder, not elsewhere classified: Secondary | ICD-10-CM | POA: Diagnosis present

## 2017-09-29 NOTE — Therapy (Signed)
[facility] [address] [place] , [place], [address] Phone: [phone]   Fax:  [phone]  Occupational Therapy Evaluation  Patient Details  Name: Lindsay Howe H Mcquire MRN: [id number] Date of Birth: [birth year] Referring Provider: Dr. Malon KindleSteven Howe   Encounter Date: 09/29/2017  OT End of Session - 09/29/17 1502    Visit Number  1    Number of Visits  24    Date for OT Re-Evaluation  12/28/17 mini reassess on 10/29/17    Authorization Type  [employer] 60 visit limit    Authorization - Visit Number  1    Authorization - Number of Visits  60    OT Start Time  1125    OT Stop Time  1205    OT Time Calculation (min)  40 min    Activity Tolerance  Patient tolerated treatment well    Behavior During Therapy  [facility] for tasks assessed/performed       Past Medical History:  Diagnosis Date  . Depression   . GERD (gastroesophageal reflux disease)   . Hyperlipemia   . Insomnia   . Thyroid disease     Past Surgical History:  Procedure Laterality Date  . BLADDER REPAIR    . BREAST ENHANCEMENT SURGERY    . CARPAL TUNNEL RELEASE     left  . COLONOSCOPY WITH ESOPHAGOGASTRODUODENOSCOPY (EGD)  07/05/2012   Procedure: COLONOSCOPY WITH ESOPHAGOGASTRODUODENOSCOPY (EGD);  Surgeon: Lindsay HippoNajeeb U Rehman, MD;  [facility];  Service: Endoscopy;  Laterality: N/A;  1200  . THYROIDECTOMY, PARTIAL    . TUBAL LIGATION      There were no vitals filed for this visit.  Subjective Assessment - 09/29/17 1458    Subjective   S:  I want to get the strength back.     Pertinent History  Lindsay Howe has been experiencing increased pain and decreased movement in her left shoulder for more than a year.  She consulted with Dr. Ranell PatrickNorris, and underwent arthroscopic RCR surgery on 09/25/17.  She has been referred to occupational therapy for evaluation and treatment following Dr. Dietrich PatesNorris's protocol.  She presents today with sling on.      Special Tests  foto:  4/100    Patient  Stated Goals  I want to get strength back and have no pain     Currently in Pain?  Yes    Pain Score  7     Pain Location  Shoulder    Pain Orientation  Left    Pain Descriptors / Indicators  Aching;Sore    Pain Type  Acute pain    Pain Radiating Towards  elbow    Pain Onset  In the past 7 days    Pain Frequency  Constant    Aggravating Factors   movement    Pain Relieving Factors  ice and medication    Effect of Pain on Daily Activities  max - unable to use her left arm with any functional activities         [facility] - 09/29/17 0001      Assessment   Medical Diagnosis  S/P Left RCR     Referring Provider  Dr. Malon KindleSteven Howe    Onset Date/Surgical Date  09/25/17    Hand Dominance  Right    Next MD Visit  10/05/17    Prior Therapy  n/a      Precautions   Precautions  Shoulder    Type of Shoulder Precautions  4/1-4/29:  pendulum, PROM:  flexion to 90, internal and external rotation, NO ABDUCTION, wrist and elbow A/ROM, isometrics, wand external and internal rotation.  4/30-5/27:  progress PROM to full, include seated P/ROM, may being AA/ROM when patient has full pain free arc, continue er/ir with wand. 6/3-6/24 progress to a/rom, light resistive exercises        Balance Screen   Has the patient fallen in the past 6 months  No    Has the patient had a decrease in activity level because of a fear of falling?   No    Is the patient reluctant to leave their home because of a fear of falling?   No      Home  Environment   Family/patient expects to be discharged to:  Private residence    Lives With  Spouse      Prior Function   Level of Independence  Independent;Independent with basic ADLs    Vocation  Retired    Leisure  cared for her 57 year old and 7 month old grandsons       ADL   ADL comments  unable to use her left arm with any adl tasks, using right arm to complete all activities       Written Expression   Dominant Hand  Right      Vision - History    Baseline Vision  Wears glasses all the time      Cognition   Overall Cognitive Status  Within Functional Limits for tasks assessed      Observation/Other Assessments   Focus on Therapeutic Outcomes (FOTO)   4/100      Sensation   Light Touch  Appears Intact      Coordination   Gross Motor Movements are Fluid and Coordinated  Yes    Fine Motor Movements are Fluid and Coordinated  Yes      ROM / Strength   AROM / PROM / Strength  AROM;PROM;Strength      Palpation   Palpation comment  mod-max fascial restrictions noted in left shoudler , scapular region       AROM   Overall AROM Comments  unable to assess due to recent surgery     AROM Assessment Site  Shoulder    Right/Left Shoulder  Left      PROM   Overall PROM Comments  assessed in supine; external and internal rotation with shoulder adducted    PROM Assessment Site  Shoulder    Right/Left Shoulder  Left    Left Shoulder Flexion  69 Degrees    Left Shoulder ABduction  30 Degrees    Left Shoulder Internal Rotation  75 Degrees    Left Shoulder External Rotation  0 Degrees      Strength   Overall Strength Comments  not assessed due to recent surgery                OT Treatments/Exercises (OP) - 09/29/17 0001      Exercises   Exercises  Shoulder      Shoulder Exercises: Supine   External Rotation  PROM;5 reps    Internal Rotation  PROM;5 reps    Flexion  PROM;5 reps    ABduction Limitations  no abduction per protocol       Manual Therapy   Manual Therapy  Soft tissue mobilization    Manual therapy comments  manual therapy interventions completed seperately from all other interventions this date    Soft tissue  mobilization  soft tissue mobilization to decersae tightness and restrictions and imrpove main free mobility in left shoulder region             OT Education - 09/29/17 1501    Education provided  Yes    Education Details  educated on pendulums, towel slide into flexion, external rotation,  a/rom of elbow-wrist    Person(s) Educated  Patient    Methods  Explanation;Demonstration;Handout    Comprehension  Verbalized understanding       OT Short Term Goals - 09/29/17 1510      OT SHORT TERM GOAL #1   Title  Patient will be educated on a HEP for improved left shoulder mobility.      Time  6    Period  Weeks    Status  New    Target Date  11/10/17      OT SHORT TERM GOAL #2   Title  Patient will increase left shouler P/ROM to [facility] in order to improve ease with donning and doffing shirts and coats.    Time  6    Period  Weeks    Status  New      OT SHORT TERM GOAL #3   Title  Patient will increase left shoulder strength to 3/5 or better for improved ability to lift bags of groceries.     Time  6    Period  Weeks    Status  New      OT SHORT TERM GOAL #4   Title  Patient will decrease pain to 5/10 or better in left shoulder during ADL tasks.     Time  6    Period  Weeks    Status  New      OT SHORT TERM GOAL #5   Title  Patient will decrease fascial restrictions from mod-max to moderate for improved mobility required for ADL completion.     Time  6    Period  Weeks    Status  New        OT Long Term Goals - 09/29/17 1512      OT LONG TERM GOAL #1   Title  Patient will return to prior level of independence with all B/IADLs and leisure actitivies using left arm actively with all tasks.     Time  12    Period  Weeks    Status  New    Target Date  12/22/17      OT LONG TERM GOAL #2   Title  Patient will improve left shoulder A/ROM to WNL for improved abiilty to reach into overhead cabinets, behind back to fasten bra.     Time  12    Period  Weeks    Status  New      OT LONG TERM GOAL #3   Title  Patient will increase left shoulder strength to 5/5 in order to lift up her grandson.     Time  12    Period  Weeks    Status  New      OT LONG TERM GOAL #4   Title  Patient will decrease left shoulder pain to 2/10 or better when reaching overhead or behind  her back.     Time  12    Period  Weeks    Status  New      OT LONG TERM GOAL #5   Title  Patient will decrease fascial restrictions to minimal or better in her  left shoulder for improved mobility required for ADL completion.     Time  12    Period  Weeks    Status  New            Plan - 09/29/17 1504    Clinical Impression Statement  A:  Patient is a 57 year old with past medical history including bilateral carpal tunnel surgery.  Patient has been experiencing increased pain and decreased movement in her left shoulder for more than a year.  Patient underwent left arthroscopic rotator cuff surgery on 09/25/17 and has been referred to occupational therapy for evaluation and treatment.  Patient is currently unable to use her left arm with any functional activities, due to precautions.  Patient is completing all daily tasks as much as she can with her right arm.      Occupational Profile and client history currently impacting functional performance  motivated, overall good health    Occupational performance deficits (Please refer to evaluation for details):  ADL's;IADL's;Rest and Sleep;Social Participation    Rehab Potential  Good    Current Impairments/barriers affecting progress:  n/a    OT Frequency  2x / week    OT Duration  12 weeks    OT Treatment/Interventions  Self-care/ADL training;Therapeutic exercise;Manual Therapy;Neuromuscular education;Ultrasound;Energy conservation;Therapeutic activities;DME and/or AE instruction;Cryotherapy;Electrical Stimulation;Scar mobilization;Moist Heat;Passive range of motion;Patient/family education    Plan  P:  Skilled OT intervention is indicated to decrease pain and fascial restrictions and improve pain free mobility needed to return to prior level of use of left arm with all daily, leisure, and desired activities.  Next session:  review plan of care, begin P/ROM following protocol,.      Clinical Decision Making  Limited treatment options, no task  modification necessary    OT Home Exercise Plan  09/29/17:  pendulum, flexion and external rotation towel slide, elbow, forearm, wrist a/rom       Patient will benefit from skilled therapeutic intervention in order to improve the following deficits and impairments:  Increased muscle spasms, Decreased scar mobility, Decreased strength, Pain, Decreased range of motion, Increased fascial restrictions, Impaired UE functional use  Visit Diagnosis: Acute pain of left shoulder - Plan: Ot plan of care cert/re-cert  Stiffness of left shoulder, not elsewhere classified - Plan: Ot plan of care cert/re-cert  Other symptoms and signs involving the musculoskeletal system - Plan: Ot plan of care cert/re-cert    Problem List Patient Active Problem List   Diagnosis Date Noted  . Reflux esophagitis 10/15/2012  . Hypothyroidism 06/06/2012  . GERD (gastroesophageal reflux disease) 06/06/2012  . Dysphagia 06/06/2012    Shirlean Mylar, MHA, OTR/L [phone]  09/29/2017, 3:31 PM  [facility] [address] [place], [place], [address] Phone: [phone]   Fax:  [phone]  Name: KRISTINE CHAHAL MRN: [id number] Date of Birth: 1960-08-25

## 2017-09-29 NOTE — Patient Instructions (Signed)
TOWEL SLIDES COMPLETE FOR 1-3 MINUTES, 3-5 TIMES PER DAY  SHOULDER: Flexion On Table   Place hands on table, elbows straight.  Slide arms across table. Hold __10_ seconds. __15_ reps per set, _3__ sets per day, __7_ days per week      Internal Rotation (Assistive)   Seated with elbow bent at right angle and held against side, slide arm on table surface in an inward arc. Repeat __15__ times. Do __3__ sessions per day. Activity: Use this motion to brush crumbs off the table.  Copyright  VHI. All rights reserved.    COMPLETE PENDULUM EXERCISES FOR 30 SECONDS TO A MINUTE EACH, 3-5 TIMES PER DAY. ROM: Pendulum (Side-to-Side)   http://orth.exer.us/792   Copyright  VHI. All rights reserved.  Pendulum Forward/Back   Bend forward 90 at waist, using table for support. Rock body forward and back to swing arm. Repeat ____ times. Do ____ sessions per day.  Copyright  VHI. All rights reserved.  Pendulum Circular   Bend forward 90 at waist, leaning on table for support. Rock body in a circular pattern to move arm clockwise ____ times then counterclockwise ____ times. Do ____ sessions per day.  Copyright  VHI. All rights reserved.  AROM: Wrist Extension   With right palm down, bend wrist up. Repeat 10____ times per set. Do ____ sets per session. Do __3__ sessions per day.  Copyright  VHI. All rights reserved.   AROM: Wrist Flexion   With right palm up, bend wrist up. Repeat ___10_ times per set. Do ____ sets per session. Do __3__ sessions per day.  Copyright  VHI. All rights reserved.   AROM: Forearm Pronation / Supination   With right arm in handshake position, slowly rotate palm down until stretch is felt. Relax. Then rotate palm up until stretch is felt. Repeat __10__ times per set. Do ____ sets per session. Do __3__ sessions per day.  Copyright  VHI. All rights reserved.   AFlexion (Passive)   Use other hand to bend elbow, with thumb toward same  shoulder. Do NOT force this motion. Hold ____ seconds. Repeat ____ times. Do ____ sessions per day.  Copyright  VHI. All rights reserved.    With left hand palm up, gently bend elbow as far as possible. Then straighten arm as far as possible. Repeat ____ times per set. Do ____ sets per session. Do ____ sessions per day.  Copyright  VHI. All rights reserved.

## 2017-10-04 ENCOUNTER — Ambulatory Visit (HOSPITAL_COMMUNITY): Payer: 59

## 2017-10-04 ENCOUNTER — Encounter (HOSPITAL_COMMUNITY): Payer: Self-pay

## 2017-10-04 ENCOUNTER — Other Ambulatory Visit: Payer: Self-pay

## 2017-10-04 DIAGNOSIS — M25612 Stiffness of left shoulder, not elsewhere classified: Secondary | ICD-10-CM

## 2017-10-04 DIAGNOSIS — M25512 Pain in left shoulder: Secondary | ICD-10-CM

## 2017-10-04 DIAGNOSIS — R29898 Other symptoms and signs involving the musculoskeletal system: Secondary | ICD-10-CM

## 2017-10-04 NOTE — Therapy (Signed)
[facility] [address] [place], [place], [address] Phone: [phone]   Fax:  [phone]  Occupational Therapy Treatment  Patient Details  Name: Lindsay Howe MRN: [id number] Date of Birth: 1961/04/28 Referring Provider: Dr. Malon KindleSteven Norris   Encounter Date: 10/04/2017  OT End of Session - 10/04/17 1155    Visit Number  2    Number of Visits  24    Date for OT Re-Evaluation  12/28/17 mini reassess on 10/29/17    Authorization Type  UHC 60 visit limit    Authorization - Visit Number  2    Authorization - Number of Visits  60    OT Start Time  1120    OT Stop Time  1200    OT Time Calculation (min)  40 min    Activity Tolerance  Patient tolerated treatment well    Behavior During Therapy  [facility] for tasks assessed/performed       Past Medical History:  Diagnosis Date  . Depression   . GERD (gastroesophageal reflux disease)   . Hyperlipemia   . Insomnia   . Thyroid disease     Past Surgical History:  Procedure Laterality Date  . BLADDER REPAIR    . BREAST ENHANCEMENT SURGERY    . CARPAL TUNNEL RELEASE     left  . COLONOSCOPY WITH ESOPHAGOGASTRODUODENOSCOPY (EGD)  07/05/2012   Procedure: COLONOSCOPY WITH ESOPHAGOGASTRODUODENOSCOPY (EGD);  Surgeon: Malissa HippoNajeeb U Rehman, MD;  [facility];  Service: Endoscopy;  Laterality: N/A;  1200  . THYROIDECTOMY, PARTIAL    . TUBAL LIGATION      There were no vitals filed for this visit.  Subjective Assessment - 10/04/17 1148    Subjective   S: I have some soreness on the inside of my elbow.    Currently in Pain?  Yes    Pain Score  3     Pain Location  Shoulder    Pain Orientation  Left    Pain Descriptors / Indicators  Aching;Sore    Pain Type  Acute pain         [facility] OT Assessment - 10/04/17 1149      Assessment   Medical Diagnosis  S/P Left RCR       Precautions   Precautions  Shoulder    Type of Shoulder Precautions  4/1-4/29:  pendulum, PROM:  flexion to 90,  internal and external rotation, NO ABDUCTION, wrist and elbow A/ROM, isometrics, wand external and internal rotation.  4/30-5/27:  progress PROM to full, include seated P/ROM, may being AA/ROM when patient has full pain free arc, continue er/ir with wand. 6/3-6/24 progress to a/rom, light resistive exercises                 OT Treatments/Exercises (OP) - 10/04/17 1150      Exercises   Exercises  Shoulder      Shoulder Exercises: Supine   Protraction  PROM;10 reps    Horizontal ABduction  -- No abduction    External Rotation  PROM;10 reps    Internal Rotation  PROM;10 reps    Flexion  PROM;10 reps    Flexion Limitations  to 90    ABduction  -- no abduction    Other Supine Exercises  Elbow flexion/extension, supination/pronation 12x; A/ROM    Other Supine Exercises  Bridging 10X      Shoulder Exercises: Therapy Ball   Flexion  10 reps      Shoulder Exercises: Isometric  Strengthening   Flexion  Supine;3X3"    Extension  Supine;3X3"    External Rotation  Supine;3X3"    Internal Rotation  Supine;3X3"    ADduction  Supine;3X3"      Manual Therapy   Manual Therapy  Soft tissue mobilization    Manual therapy comments  manual therapy interventions completed seperately from all other interventions this date    Soft tissue mobilization  soft tissue mobilization to decersae tightness and restrictions and imrpove main free mobility in left shoulder region              OT Education - 10/04/17 1155    Education provided  Yes    Education Details  Pt was provided a print out of OT evaluation. Reviewed goals and plan of care. Reviewed use of ice versus heat use at this time. Education provided regarding proper positioning of LUE in sling.     Person(s) Educated  Patient    Methods  Explanation;Demonstration;Handout    Comprehension  Verbalized understanding       OT Short Term Goals - 10/04/17 1148      OT SHORT TERM GOAL #1   Title  Patient will be educated on a HEP for  improved left shoulder mobility.      Time  6    Period  Weeks    Status  On-going      OT SHORT TERM GOAL #2   Title  Patient will increase left shouler P/ROM to [facility] in order to improve ease with donning and doffing shirts and coats.    Time  6    Period  Weeks    Status  On-going      OT SHORT TERM GOAL #3   Title  Patient will increase left shoulder strength to 3/5 or better for improved ability to lift bags of groceries.     Time  6    Period  Weeks    Status  On-going      OT SHORT TERM GOAL #4   Title  Patient will decrease pain to 5/10 or better in left shoulder during ADL tasks.     Time  6    Period  Weeks    Status  On-going      OT SHORT TERM GOAL #5   Title  Patient will decrease fascial restrictions from mod-max to moderate for improved mobility required for ADL completion.     Time  6    Period  Weeks    Status  On-going        OT Long Term Goals - 10/04/17 1148      OT LONG TERM GOAL #1   Title  Patient will return to prior level of independence with all B/IADLs and leisure actitivies using left arm actively with all tasks.     Time  12    Period  Weeks    Status  On-going      OT LONG TERM GOAL #2   Title  Patient will improve left shoulder A/ROM to WNL for improved abiilty to reach into overhead cabinets, behind back to fasten bra.     Time  12    Period  Weeks    Status  On-going      OT LONG TERM GOAL #3   Title  Patient will increase left shoulder strength to 5/5 in order to lift up her grandson.     Time  12    Period  Weeks  Status  On-going      OT LONG TERM GOAL #4   Title  Patient will decrease left shoulder pain to 2/10 or better when reaching overhead or behind her back.     Time  12    Period  Weeks    Status  On-going      OT LONG TERM GOAL #5   Title  Patient will decrease fascial restrictions to minimal or better in her left shoulder for improved mobility required for ADL completion.     Time  12    Period  Weeks    Status   On-going            Plan - 10/04/17 1214    Clinical Impression Statement  A: Initiated myofascial release for increased fascial restrictions in left upper arm, trapezius, and medial elbow region, manual stretching while following protocol, gentle isometrics, and therapy ball flexion. VC for form and technique. patient continues to have increased fascial restrictions in LUE.     Plan  P: Continue with protocol. Add IR/er with wand supine.       Patient will benefit from skilled therapeutic intervention in order to improve the following deficits and impairments:  Increased muscle spasms, Decreased scar mobility, Decreased strength, Pain, Decreased range of motion, Increased fascial restrictions, Impaired UE functional use  Visit Diagnosis: Acute pain of left shoulder  Stiffness of left shoulder, not elsewhere classified  Other symptoms and signs involving the musculoskeletal system    Problem List Patient Active Problem List   Diagnosis Date Noted  . Reflux esophagitis 10/15/2012  . Hypothyroidism 06/06/2012  . GERD (gastroesophageal reflux disease) 06/06/2012  . Dysphagia 06/06/2012   Limmie Patricia, OTR/L,CBIS  [phone]  10/04/2017, 12:16 PM  [facility] [address] [place], [place], [address] Phone: [phone]   Fax:  [phone]  Name: Lindsay Howe MRN: [id number] Date of Birth: 07/28/1960

## 2017-10-05 ENCOUNTER — Other Ambulatory Visit (HOSPITAL_COMMUNITY): Payer: Self-pay | Admitting: Internal Medicine

## 2017-10-05 ENCOUNTER — Ambulatory Visit (HOSPITAL_COMMUNITY)
Admission: RE | Admit: 2017-10-05 | Discharge: 2017-10-05 | Disposition: A | Discharge status: Home / Self Care | Payer: 59 | Source: Ambulatory Visit | Attending: Cardiology | Admitting: Cardiology

## 2017-10-05 DIAGNOSIS — M79602 Pain in left arm: Secondary | ICD-10-CM

## 2017-10-05 DIAGNOSIS — I82722 Chronic embolism and thrombosis of deep veins of left upper extremity: Secondary | ICD-10-CM | POA: Insufficient documentation

## 2017-10-05 DIAGNOSIS — I82A12 Acute embolism and thrombosis of left axillary vein: Secondary | ICD-10-CM | POA: Insufficient documentation

## 2017-10-05 DIAGNOSIS — I82890 Acute embolism and thrombosis of other specified veins: Secondary | ICD-10-CM | POA: Diagnosis not present

## 2017-10-06 ENCOUNTER — Ambulatory Visit (HOSPITAL_COMMUNITY): Payer: 59 | Admitting: Occupational Therapy

## 2017-10-06 ENCOUNTER — Encounter (HOSPITAL_COMMUNITY): Payer: Self-pay | Admitting: Occupational Therapy

## 2017-10-06 DIAGNOSIS — M25512 Pain in left shoulder: Secondary | ICD-10-CM | POA: Diagnosis not present

## 2017-10-06 DIAGNOSIS — M25612 Stiffness of left shoulder, not elsewhere classified: Secondary | ICD-10-CM

## 2017-10-06 DIAGNOSIS — R29898 Other symptoms and signs involving the musculoskeletal system: Secondary | ICD-10-CM

## 2017-10-06 NOTE — Therapy (Signed)
[place] [facility] [address] [place], [place], [address] Phone: [phone]   Fax:  [phone]  Occupational Therapy Treatment  Patient Details  Name: Lindsay Howe MRN: [id number] Date of Birth: Nov 13, 1960 Referring Provider: Dr. Malon Kindle   Encounter Date: 10/06/2017  OT End of Session - 10/06/17 1645    Visit Number  3    Number of Visits  24    Date for OT Re-Evaluation  12/28/17 mini reassess on 10/29/17    Authorization Type  [employer] 60 visit limit    Authorization - Visit Number  3    Authorization - Number of Visits  60    OT Start Time  1602    OT Stop Time  1641    OT Time Calculation (min)  39 min    Activity Tolerance  Patient tolerated treatment well    Behavior During Therapy  [facility] for tasks assessed/performed       Past Medical History:  Diagnosis Date  . Depression   . GERD (gastroesophageal reflux disease)   . Hyperlipemia   . Insomnia   . Thyroid disease     Past Surgical History:  Procedure Laterality Date  . BLADDER REPAIR    . BREAST ENHANCEMENT SURGERY    . CARPAL TUNNEL RELEASE     left  . COLONOSCOPY WITH ESOPHAGOGASTRODUODENOSCOPY (EGD)  07/05/2012   Procedure: COLONOSCOPY WITH ESOPHAGOGASTRODUODENOSCOPY (EGD);  Surgeon: Malissa Hippo, MD;  [facility];  Service: Endoscopy;  Laterality: N/A;  1200  . THYROIDECTOMY, PARTIAL    . TUBAL LIGATION      There were no vitals filed for this visit.  Subjective Assessment - 10/06/17 1601    Subjective   S: They found a blood clot in my elbow.     Currently in Pain?  Yes    Pain Score  3     Pain Location  Shoulder    Pain Orientation  Left    Pain Descriptors / Indicators  Aching;Sore    Pain Type  Acute pain    Pain Radiating Towards  elbow    Pain Onset  In the past 7 days    Pain Frequency  Constant    Aggravating Factors   movement    Pain Relieving Factors  ice and medication    Effect of Pain on Daily Activities  max-unable to use  left arm with functional tasks    Multiple Pain Howe  No         [facility] OT Assessment - 10/06/17 1601      Assessment   Medical Diagnosis  S/P Left RCR       Precautions   Precautions  Shoulder    Type of Shoulder Precautions  4/1-4/29:  pendulum, PROM:  flexion to 90, internal and external rotation, NO ABDUCTION, wrist and elbow A/ROM, isometrics, wand external and internal rotation.  4/30-5/27:  progress PROM to full, include seated P/ROM, may being AA/ROM when patient has full pain free arc, continue er/ir with wand. 6/3-6/24 progress to a/rom, light resistive exercises                 OT Treatments/Exercises (OP) - 10/06/17 1604      Exercises   Exercises  Shoulder      Shoulder Exercises: Supine   Protraction  PROM;10 reps    Horizontal ABduction  -- no abduction    External Rotation  PROM;10 reps    Internal Rotation  PROM;10 reps    Flexion  PROM;10 reps    Flexion Limitations  to 90    ABduction  -- no abduction    Other Supine Exercises  Elbow flexion/extension, supination/pronation 12x; A/ROM      Shoulder Exercises: Therapy Ball   Flexion  10 reps      Shoulder Exercises: ROM/Strengthening   Pendulum  30" each, fowards/backward, circles    Anterior Glide  3X5"      Shoulder Exercises: Isometric Strengthening   Flexion  Supine;3X3"    Extension  Supine;3X3"    External Rotation  Supine;3X3"    Internal Rotation  Supine;3X3"    ADduction  Supine;3X3"      Manual Therapy   Manual Therapy  Soft tissue mobilization    Manual therapy comments  manual therapy interventions completed seperately from all other interventions this date    Soft tissue mobilization  soft tissue mobilization to decrease tightness and restrictions and imrpove main free mobility in left shoulder region                OT Short Term Goals - 10/04/17 1148      OT SHORT TERM GOAL #1   Title  Patient will be educated on a HEP for improved left shoulder mobility.       Time  6    Period  Weeks    Status  On-going      OT SHORT TERM GOAL #2   Title  Patient will increase left shouler P/ROM to [facility] in order to improve ease with donning and doffing shirts and coats.    Time  6    Period  Weeks    Status  On-going      OT SHORT TERM GOAL #3   Title  Patient will increase left shoulder strength to 3/5 or better for improved ability to lift bags of groceries.     Time  6    Period  Weeks    Status  On-going      OT SHORT TERM GOAL #4   Title  Patient will decrease pain to 5/10 or better in left shoulder during ADL tasks.     Time  6    Period  Weeks    Status  On-going      OT SHORT TERM GOAL #5   Title  Patient will decrease fascial restrictions from mod-max to moderate for improved mobility required for ADL completion.     Time  6    Period  Weeks    Status  On-going        OT Long Term Goals - 10/04/17 1148      OT LONG TERM GOAL #1   Title  Patient will return to prior level of independence with all B/IADLs and leisure actitivies using left arm actively with all tasks.     Time  12    Period  Weeks    Status  On-going      OT LONG TERM GOAL #2   Title  Patient will improve left shoulder A/ROM to WNL for improved abiilty to reach into overhead cabinets, behind back to fasten bra.     Time  12    Period  Weeks    Status  On-going      OT LONG TERM GOAL #3   Title  Patient will increase left shoulder strength to 5/5 in order to lift up her grandson.     Time  12  Period  Weeks    Status  On-going      OT LONG TERM GOAL #4   Title  Patient will decrease left shoulder pain to 2/10 or better when reaching overhead or behind her back.     Time  12    Period  Weeks    Status  On-going      OT LONG TERM GOAL #5   Title  Patient will decrease fascial restrictions to minimal or better in her left shoulder for improved mobility required for ADL completion.     Time  12    Period  Weeks    Status  On-going            Plan -  10/06/17 1645    Clinical Impression Statement  A: Pt reports MD found a blood clot at her elbow and she began Zarelto yesterday, therefore no manual therapy completed at elbow region, manual focused at deltoid and scapular regions to reduce soft tissue restrictions. Continued with isometrics and P/ROM, added anterior glide today. Verbal cuing for form and technique, pt reports feeling looser at end of session.     Plan  P: continue with protocol, add er/IR in supine with wand       Patient will benefit from skilled therapeutic intervention in order to improve the following deficits and impairments:  Increased muscle spasms, Decreased scar mobility, Decreased strength, Pain, Decreased range of motion, Increased fascial restrictions, Impaired UE functional use  Visit Diagnosis: Acute pain of left shoulder  Stiffness of left shoulder, not elsewhere classified  Other symptoms and signs involving the musculoskeletal system    Problem List Patient Active Problem List   Diagnosis Date Noted  . Reflux esophagitis 10/15/2012  . Hypothyroidism 06/06/2012  . GERD (gastroesophageal reflux disease) 06/06/2012  . Dysphagia 06/06/2012   Lindsay Howe, OTR/L  [phone] 10/06/2017, 4:48 PM  [facility] [address] [place], [place], [address] Phone: [phone]   Fax:  [phone]  Name: Lindsay Howe MRN: [id number] Date of Birth: Oct 31, 1960

## 2017-10-09 ENCOUNTER — Ambulatory Visit (HOSPITAL_COMMUNITY): Payer: 59

## 2017-10-09 ENCOUNTER — Encounter (HOSPITAL_COMMUNITY): Payer: Self-pay

## 2017-10-09 ENCOUNTER — Other Ambulatory Visit: Payer: Self-pay

## 2017-10-09 DIAGNOSIS — M25612 Stiffness of left shoulder, not elsewhere classified: Secondary | ICD-10-CM

## 2017-10-09 DIAGNOSIS — R29898 Other symptoms and signs involving the musculoskeletal system: Secondary | ICD-10-CM

## 2017-10-09 DIAGNOSIS — M25512 Pain in left shoulder: Secondary | ICD-10-CM

## 2017-10-09 NOTE — Therapy (Signed)
[place] [facility] [address] [place], [place], [address] Phone: [phone]   Fax:  [phone]  Occupational Therapy Treatment  Patient Details  Name: Lindsay Howe MRN: [id number] Date of Birth: 28-Aug-1960 Referring Provider: Dr. Malon Kindle   Encounter Date: 10/09/2017  OT End of Session - 10/09/17 1510    Visit Number  4    Number of Visits  24    Date for OT Re-Evaluation  12/28/17 mini reassess on 10/29/17    Authorization Type  UHC 60 visit limit    Authorization - Visit Number  4    Authorization - Number of Visits  60    OT Start Time  1433    OT Stop Time  1511    OT Time Calculation (min)  38 min    Activity Tolerance  Patient tolerated treatment well    Behavior During Therapy  [facility] for tasks assessed/performed       Past Medical History:  Diagnosis Date  . Depression   . GERD (gastroesophageal reflux disease)   . Hyperlipemia   . Insomnia   . Thyroid disease     Past Surgical History:  Procedure Laterality Date  . BLADDER REPAIR    . BREAST ENHANCEMENT SURGERY    . CARPAL TUNNEL RELEASE     left  . COLONOSCOPY WITH ESOPHAGOGASTRODUODENOSCOPY (EGD)  07/05/2012   Procedure: COLONOSCOPY WITH ESOPHAGOGASTRODUODENOSCOPY (EGD);  Surgeon: Malissa Hippo, MD;  [facility];  Service: Endoscopy;  Laterality: N/A;  1200  . THYROIDECTOMY, PARTIAL    . TUBAL LIGATION      There were no vitals filed for this visit.  Subjective Assessment - 10/09/17 1453    Subjective   S: I took one tylenol before I came here.     Currently in Pain?  No/denies         [facility] OT Assessment - 10/09/17 1454      Assessment   Medical Diagnosis  S/P Left RCR       Precautions   Precautions  Shoulder    Type of Shoulder Precautions  4/1-4/29:  pendulum, PROM:  flexion to 90, internal and external rotation, NO ABDUCTION, wrist and elbow A/ROM, isometrics, wand external and internal rotation.  4/30-5/27:  progress PROM to full,  include seated P/ROM, may being AA/ROM when patient has full pain free arc, continue er/ir with wand. 6/3-6/24 progress to a/rom, light resistive exercises      Shoulder Interventions  Shoulder sling/immobilizer               OT Treatments/Exercises (OP) - 10/09/17 1454      Exercises   Exercises  Shoulder      Shoulder Exercises: Supine   Protraction  PROM;10 reps    Horizontal ABduction  -- no abduction    External Rotation  PROM;AAROM;10 reps    Internal Rotation  PROM;AAROM;10 reps    Flexion  PROM;10 reps    Flexion Limitations  to 90    ABduction  -- no abduction    Other Supine Exercises  Serratus anterior punch; P/ROM; 10X      Shoulder Exercises: Seated   Extension  AROM;12 reps    Row  AROM;12 reps    Other Seated Exercises  Scapular depression; 12X; A/ROM      Shoulder Exercises: Therapy Ball   Flexion  -- 12X      Shoulder Exercises: ROM/Strengthening   Thumb Tacks  low level 1'  Anterior Glide  3X5"      Shoulder Exercises: Isometric Strengthening   Flexion  Supine;3X3"    Extension  Supine;3X3"    External Rotation  Supine;3X3"    Internal Rotation  Supine;3X3"    ADduction  Supine;3X3"      Manual Therapy   Manual Therapy  Soft tissue mobilization    Manual therapy comments  manual therapy interventions completed seperately from all other interventions this date    Soft tissue mobilization  soft tissue mobilization to decrease tightness and restrictions and imrpove main free mobility in left shoulder region                OT Short Term Goals - 10/04/17 1148      OT SHORT TERM GOAL #1   Title  Patient will be educated on a HEP for improved left shoulder mobility.      Time  6    Period  Weeks    Status  On-going      OT SHORT TERM GOAL #2   Title  Patient will increase left shouler P/ROM to [facility] in order to improve ease with donning and doffing shirts and coats.    Time  6    Period  Weeks    Status  On-going      OT SHORT  TERM GOAL #3   Title  Patient will increase left shoulder strength to 3/5 or better for improved ability to lift bags of groceries.     Time  6    Period  Weeks    Status  On-going      OT SHORT TERM GOAL #4   Title  Patient will decrease pain to 5/10 or better in left shoulder during ADL tasks.     Time  6    Period  Weeks    Status  On-going      OT SHORT TERM GOAL #5   Title  Patient will decrease fascial restrictions from mod-max to moderate for improved mobility required for ADL completion.     Time  6    Period  Weeks    Status  On-going        OT Long Term Goals - 10/04/17 1148      OT LONG TERM GOAL #1   Title  Patient will return to prior level of independence with all B/IADLs and leisure actitivies using left arm actively with all tasks.     Time  12    Period  Weeks    Status  On-going      OT LONG TERM GOAL #2   Title  Patient will improve left shoulder A/ROM to WNL for improved abiilty to reach into overhead cabinets, behind back to fasten bra.     Time  12    Period  Weeks    Status  On-going      OT LONG TERM GOAL #3   Title  Patient will increase left shoulder strength to 5/5 in order to lift up her grandson.     Time  12    Period  Weeks    Status  On-going      OT LONG TERM GOAL #4   Title  Patient will decrease left shoulder pain to 2/10 or better when reaching overhead or behind her back.     Time  12    Period  Weeks    Status  On-going      OT LONG TERM GOAL #5  Title  Patient will decrease fascial restrictions to minimal or better in her left shoulder for improved mobility required for ADL completion.     Time  12    Period  Weeks    Status  On-going            Plan - 10/09/17 1511    Clinical Impression Statement  A: Added IR/er supine AA/ROM per protocol. Added low level thumb tacks to increase shoulder joint mobility. VC for form and technique. Patient was educated on how to complete a straight arm rotation at home if she wishes.      Plan  P: Continue to follow protocol. Increase isometric holds to 5 seconds next session.     Consulted and Agree with Plan of Care  Patient       Patient will benefit from skilled therapeutic intervention in order to improve the following deficits and impairments:  Increased muscle spasms, Decreased scar mobility, Decreased strength, Pain, Decreased range of motion, Increased fascial restrictions, Impaired UE functional use  Visit Diagnosis: Acute pain of left shoulder  Stiffness of left shoulder, not elsewhere classified  Other symptoms and signs involving the musculoskeletal system    Problem List Patient Active Problem List   Diagnosis Date Noted  . Reflux esophagitis 10/15/2012  . Hypothyroidism 06/06/2012  . GERD (gastroesophageal reflux disease) 06/06/2012  . Dysphagia 06/06/2012   Limmie Patricia, OTR/L,CBIS  [phone]  10/09/2017, 3:14 PM  [place] [facility] [address] [place], [place], [address] Phone: [phone]   Fax:  [phone]  Name: DONNICA JARNAGIN MRN: [id number] Date of Birth: 13-Jul-1960

## 2017-10-12 ENCOUNTER — Ambulatory Visit (HOSPITAL_COMMUNITY): Payer: 59

## 2017-10-12 ENCOUNTER — Other Ambulatory Visit: Payer: Self-pay

## 2017-10-12 ENCOUNTER — Encounter (HOSPITAL_COMMUNITY): Payer: Self-pay

## 2017-10-12 DIAGNOSIS — M25612 Stiffness of left shoulder, not elsewhere classified: Secondary | ICD-10-CM

## 2017-10-12 DIAGNOSIS — R29898 Other symptoms and signs involving the musculoskeletal system: Secondary | ICD-10-CM

## 2017-10-12 DIAGNOSIS — M25512 Pain in left shoulder: Secondary | ICD-10-CM

## 2017-10-12 NOTE — Therapy (Signed)
[place], [place], [address] Phone: [phone]   Fax:  [phone]  Occupational Therapy Treatment  Patient Details  Name: Lindsay Howe MRN: [id number] Date of Birth: [birth year] Referring Provider: Dr. Esmond Plants   Encounter Date: 10/12/2017  OT End of Session - 10/12/17 1335    Visit Number  5    Number of Visits  24    Date for OT Re-Evaluation  12/28/17 mini reassess on 10/29/17    Authorization Type  UHC 60 visit limit    Authorization - Visit Number  5    Authorization - Number of Visits  60    OT Start Time  1113    OT Stop Time  1151    OT Time Calculation (min)  38 min    Activity Tolerance  Patient tolerated treatment well    Behavior During Therapy  [facility] for tasks assessed/performed       Past Medical History:  Diagnosis Date  . Depression   . GERD (gastroesophageal reflux disease)   . Hyperlipemia   . Insomnia   . Thyroid disease     Past Surgical History:  Procedure Laterality Date  . BLADDER REPAIR    . BREAST ENHANCEMENT SURGERY    . CARPAL TUNNEL RELEASE     left  . COLONOSCOPY WITH ESOPHAGOGASTRODUODENOSCOPY (EGD)  07/05/2012   Procedure: COLONOSCOPY WITH ESOPHAGOGASTRODUODENOSCOPY (EGD);  Surgeon: Rogene Houston, MD;  [facility];  Service: Endoscopy;  Laterality: N/A;  1200  . THYROIDECTOMY, PARTIAL    . TUBAL LIGATION      There were no vitals filed for this visit.  Subjective Assessment - 10/12/17 1334    Subjective   S: I'm a little sore today.    Currently in Pain?  Yes    Pain Score  5     Pain Location  Shoulder    Pain Orientation  Left    Pain Descriptors / Indicators  Sore    Pain Type  Acute pain                   OT Treatments/Exercises (OP) - 10/12/17 1130      Exercises   Exercises  Shoulder      Shoulder Exercises: Supine   Protraction  PROM;10 reps    Horizontal ABduction  -- no abduction    External Rotation  PROM;10  reps;AAROM;12 reps    Internal Rotation  PROM;10 reps;AAROM;12 reps    Flexion  PROM;10 reps    Flexion Limitations  to 90    ABduction  -- no abduction    Other Supine Exercises  Serratus anterior punch; P/ROM; 10X    Other Supine Exercises  Bridging 10X      Shoulder Exercises: Seated   Extension  AROM;12 reps    Row  AROM;12 reps    Other Seated Exercises  Scapular depression; 12X; A/ROM      Shoulder Exercises: Therapy Ball   Flexion  -- 12X      Shoulder Exercises: ROM/Strengthening   Thumb Tacks  low level 1'    Anterior Glide  3x10"    Prot/Ret//Elev/Dep  1'      Shoulder Exercises: Isometric Strengthening   Flexion  Supine;5X5"    Extension  Supine;5X5"    External Rotation  Supine;5X5"    Internal Rotation  Supine;5X5"    ADduction  Supine;5X5"      Manual Therapy   Manual Therapy  Soft tissue mobilization    Manual therapy comments  manual therapy interventions completed seperately from all other interventions this date    Soft tissue mobilization  soft tissue mobilization to decrease tightness and restrictions and imrpove main free mobility in left shoulder region                OT Short Term Goals - 10/04/17 1148      OT SHORT TERM GOAL #1   Title  Patient will be educated on a HEP for improved left shoulder mobility.      Time  6    Period  Weeks    Status  On-going      OT SHORT TERM GOAL #2   Title  Patient will increase left shouler P/ROM to [facility] in order to improve ease with donning and doffing shirts and coats.    Time  6    Period  Weeks    Status  On-going      OT SHORT TERM GOAL #3   Title  Patient will increase left shoulder strength to 3/5 or better for improved ability to lift bags of groceries.     Time  6    Period  Weeks    Status  On-going      OT SHORT TERM GOAL #4   Title  Patient will decrease pain to 5/10 or better in left shoulder during ADL tasks.     Time  6    Period  Weeks    Status  On-going      OT SHORT TERM  GOAL #5   Title  Patient will decrease fascial restrictions from mod-max to moderate for improved mobility required for ADL completion.     Time  6    Period  Weeks    Status  On-going        OT Long Term Goals - 10/04/17 1148      OT LONG TERM GOAL #1   Title  Patient will return to prior level of independence with all B/IADLs and leisure actitivies using left arm actively with all tasks.     Time  12    Period  Weeks    Status  On-going      OT LONG TERM GOAL #2   Title  Patient will improve left shoulder A/ROM to WNL for improved abiilty to reach into overhead cabinets, behind back to fasten bra.     Time  12    Period  Weeks    Status  On-going      OT LONG TERM GOAL #3   Title  Patient will increase left shoulder strength to 5/5 in order to lift up her grandson.     Time  12    Period  Weeks    Status  On-going      OT LONG TERM GOAL #4   Title  Patient will decrease left shoulder pain to 2/10 or better when reaching overhead or behind her back.     Time  12    Period  Weeks    Status  On-going      OT LONG TERM GOAL #5   Title  Patient will decrease fascial restrictions to minimal or better in her left shoulder for improved mobility required for ADL completion.     Time  12    Period  Weeks    Status  On-going            Plan - 10/12/17  1335    Clinical Impression Statement  A: Added pro/ret/elev/dep this session as well as increased isometric holds to 5 seconds for 5 repetitions. patient was able to complete with VC for form and technique. Patient has min fascial restrictions in LUE and manual therapy was completed to decrease restrictions in order to increase ROM.     Plan  P: Continue to follow protocol. Work on increasing tolerance with external rotation while increasing ROM to tolerance.     Consulted and Agree with Plan of Care  Patient       Patient will benefit from skilled therapeutic intervention in order to improve the following deficits and  impairments:  Increased muscle spasms, Decreased scar mobility, Decreased strength, Pain, Decreased range of motion, Increased fascial restrictions, Impaired UE functional use  Visit Diagnosis: Acute pain of left shoulder  Stiffness of left shoulder, not elsewhere classified  Other symptoms and signs involving the musculoskeletal system    Problem List Patient Active Problem List   Diagnosis Date Noted  . Reflux esophagitis 10/15/2012  . Hypothyroidism 06/06/2012  . GERD (gastroesophageal reflux disease) 06/06/2012  . Dysphagia 06/06/2012    Ailene Ravel, OTR/L,CBIS  [phone]  10/12/2017, 1:39 PM  [place] [address] [place], [place], [address] Phone: [phone]   Fax:  [phone]  Name: Lindsay Howe MRN: [id number] Date of Birth: 1961/05/10

## 2017-10-16 ENCOUNTER — Ambulatory Visit (HOSPITAL_COMMUNITY): Payer: 59

## 2017-10-17 ENCOUNTER — Other Ambulatory Visit: Payer: Self-pay

## 2017-10-17 ENCOUNTER — Ambulatory Visit (HOSPITAL_COMMUNITY): Payer: 59

## 2017-10-17 ENCOUNTER — Encounter (HOSPITAL_COMMUNITY): Payer: Self-pay

## 2017-10-17 DIAGNOSIS — M25512 Pain in left shoulder: Secondary | ICD-10-CM | POA: Diagnosis not present

## 2017-10-17 DIAGNOSIS — R29898 Other symptoms and signs involving the musculoskeletal system: Secondary | ICD-10-CM

## 2017-10-17 DIAGNOSIS — M25612 Stiffness of left shoulder, not elsewhere classified: Secondary | ICD-10-CM

## 2017-10-17 NOTE — Therapy (Signed)
[place], [place], [address] Phone: [phone]   Fax:  [phone]  Occupational Therapy Treatment  Patient Details  Name: Lindsay Howe MRN: [id number] Date of Birth: 16-Jan-1961 Referring Provider: Dr. Esmond Plants   Encounter Date: 10/17/2017  OT End of Session - 10/17/17 0926    Visit Number  6    Number of Visits  24    Date for OT Re-Evaluation  12/28/17 mini reassess on 10/29/17    Authorization Type  [employer] 60 visit limit    Authorization - Visit Number  6    Authorization - Number of Visits  60    OT Start Time  0905    OT Stop Time  0939    OT Time Calculation (min)  34 min    Activity Tolerance  Patient tolerated treatment well    Behavior During Therapy  [facility] for tasks assessed/performed       Past Medical History:  Diagnosis Date  . Depression   . GERD (gastroesophageal reflux disease)   . Hyperlipemia   . Insomnia   . Thyroid disease     Past Surgical History:  Procedure Laterality Date  . BLADDER REPAIR    . BREAST ENHANCEMENT SURGERY    . CARPAL TUNNEL RELEASE     left  . COLONOSCOPY WITH ESOPHAGOGASTRODUODENOSCOPY (EGD)  07/05/2012   Procedure: COLONOSCOPY WITH ESOPHAGOGASTRODUODENOSCOPY (EGD);  Surgeon: Rogene Houston, MD;  [facility];  Service: Endoscopy;  Laterality: N/A;  1200  . THYROIDECTOMY, PARTIAL    . TUBAL LIGATION      There were no vitals filed for this visit.  Subjective Assessment - 10/17/17 0923    Subjective   S: It feels a lot better than the other day.    Currently in Pain?  Yes    Pain Score  2     Pain Location  Shoulder    Pain Orientation  Left    Pain Descriptors / Indicators  Sore    Pain Type  Acute pain    Pain Radiating Towards  N/A    Pain Onset  1 to 4 weeks ago    Pain Frequency  Constant    Aggravating Factors   movement    Pain Relieving Factors  ice and medication    Effect of Pain on Daily Activities  max - unable to use LUE  for tasks.    Multiple Pain Sites  No         [facility] OT Assessment - 10/17/17 0924      Assessment   Medical Diagnosis  S/P Left RCR       Precautions   Precautions  Shoulder    Type of Shoulder Precautions  4/1-4/29:  pendulum, PROM:  flexion to 90, internal and external rotation, NO ABDUCTION, wrist and elbow A/ROM, isometrics, wand external and internal rotation.  4/30-5/27:  progress PROM to full, include seated P/ROM, may being AA/ROM when patient has full pain free arc, continue er/ir with wand. 6/3-6/24 progress to a/rom, light resistive exercises      Shoulder Interventions  Shoulder sling/immobilizer               OT Treatments/Exercises (OP) - 10/17/17 0924      Exercises   Exercises  Shoulder      Shoulder Exercises: Supine   Protraction  PROM;10 reps    Horizontal ABduction  -- no abduction    External Rotation  PROM;10 reps;AAROM;12 reps    Internal Rotation  PROM;10 reps;AAROM;12 reps    Flexion  PROM;10 reps    Flexion Limitations  past 90 today    ABduction  -- no abduction      Shoulder Exercises: Seated   Extension  AROM;15 reps    Row  AROM;15 reps    Other Seated Exercises  Scapular depression; 15X; A/ROM      Shoulder Exercises: Therapy Ball   Flexion  15 reps      Shoulder Exercises: ROM/Strengthening   Thumb Tacks  low level 1'    Anterior Glide  3x15"    Prot/Ret//Elev/Dep  1'      Shoulder Exercises: Isometric Strengthening   Flexion  Supine;5X5"    Extension  Supine;5X5"    External Rotation  Supine;5X5"    Internal Rotation  Supine;5X5"    ADduction  Supine;5X5"      Manual Therapy   Manual Therapy  Soft tissue mobilization    Manual therapy comments  manual therapy interventions completed seperately from all other interventions this date    Soft tissue mobilization  soft tissue mobilization to decrease tightness and restrictions and imrpove main free mobility in left shoulder region                OT Short Term  Goals - 10/04/17 1148      OT SHORT TERM GOAL #1   Title  Patient will be educated on a HEP for improved left shoulder mobility.      Time  6    Period  Weeks    Status  On-going      OT SHORT TERM GOAL #2   Title  Patient will increase left shouler P/ROM to [facility] in order to improve ease with donning and doffing shirts and coats.    Time  6    Period  Weeks    Status  On-going      OT SHORT TERM GOAL #3   Title  Patient will increase left shoulder strength to 3/5 or better for improved ability to lift bags of groceries.     Time  6    Period  Weeks    Status  On-going      OT SHORT TERM GOAL #4   Title  Patient will decrease pain to 5/10 or better in left shoulder during ADL tasks.     Time  6    Period  Weeks    Status  On-going      OT SHORT TERM GOAL #5   Title  Patient will decrease fascial restrictions from mod-max to moderate for improved mobility required for ADL completion.     Time  6    Period  Weeks    Status  On-going        OT Long Term Goals - 10/04/17 1148      OT LONG TERM GOAL #1   Title  Patient will return to prior level of independence with all B/IADLs and leisure actitivies using left arm actively with all tasks.     Time  12    Period  Weeks    Status  On-going      OT LONG TERM GOAL #2   Title  Patient will improve left shoulder A/ROM to WNL for improved abiilty to reach into overhead cabinets, behind back to fasten bra.     Time  12    Period  Weeks    Status  On-going  OT LONG TERM GOAL #3   Title  Patient will increase left shoulder strength to 5/5 in order to lift up her grandson.     Time  12    Period  Weeks    Status  On-going      OT LONG TERM GOAL #4   Title  Patient will decrease left shoulder pain to 2/10 or better when reaching overhead or behind her back.     Time  12    Period  Weeks    Status  On-going      OT LONG TERM GOAL #5   Title  Patient will decrease fascial restrictions to minimal or better in her left  shoulder for improved mobility required for ADL completion.     Time  12    Period  Weeks    Status  On-going            Plan - 10/17/17 0929    Clinical Impression Statement  A: As patient has been tolerating ROM restrictions so well with no pain, therapist progressed passed 90 degrees for shoulder flexion as allowed per protocol. Fascial restristrictions have decreased in LUE although continue to be present. Manual therapy techniques completed to address.     Plan  P: Continue to follow protocol. Work on increasing tolerance with external rotation and flexion while increasing ROM to tolerance.     Consulted and Agree with Plan of Care  Patient       Patient will benefit from skilled therapeutic intervention in order to improve the following deficits and impairments:  Increased muscle spasms, Decreased scar mobility, Decreased strength, Pain, Decreased range of motion, Increased fascial restrictions, Impaired UE functional use  Visit Diagnosis: Stiffness of left shoulder, not elsewhere classified  Acute pain of left shoulder  Other symptoms and signs involving the musculoskeletal system    Problem List Patient Active Problem List   Diagnosis Date Noted  . Reflux esophagitis 10/15/2012  . Hypothyroidism 06/06/2012  . GERD (gastroesophageal reflux disease) 06/06/2012  . Dysphagia 06/06/2012   Ailene Ravel, OTR/L,CBIS  [phone]  10/17/2017, 9:41 AM  [place] [address] [place], [place], [address] Phone: [phone]   Fax:  [phone]  Name: ARIYANAH AGUADO MRN: [id number] Date of Birth: [birth year]

## 2017-10-19 ENCOUNTER — Encounter (HOSPITAL_COMMUNITY): Payer: 59

## 2017-10-19 ENCOUNTER — Encounter (HOSPITAL_COMMUNITY): Payer: 59 | Admitting: Occupational Therapy

## 2017-10-24 ENCOUNTER — Encounter (HOSPITAL_COMMUNITY): Payer: Self-pay | Admitting: Occupational Therapy

## 2017-10-24 ENCOUNTER — Ambulatory Visit (HOSPITAL_COMMUNITY): Payer: 59 | Admitting: Occupational Therapy

## 2017-10-24 DIAGNOSIS — R29898 Other symptoms and signs involving the musculoskeletal system: Secondary | ICD-10-CM

## 2017-10-24 DIAGNOSIS — M25612 Stiffness of left shoulder, not elsewhere classified: Secondary | ICD-10-CM

## 2017-10-24 DIAGNOSIS — M25512 Pain in left shoulder: Secondary | ICD-10-CM | POA: Diagnosis not present

## 2017-10-24 NOTE — Patient Instructions (Signed)
Shoulder Isometric Exercises  Complete 5x each, holding for 5 seconds. Complete 2-3x/day.   1) Shoulder Flexion Standing up, place a towel between your arm/wrist and the wall. Press your arm forward into the wall gently, without moving your shoulder.     2) Shoulder Extension Standing with your back against a wall. Bend elbow to 90 degrees and place a towel between your bent elbow and the wall.  Press your elbow back into the wall gently, without moving your shoulder.        3) Shoulder Internal Rotation Standing just behind a doorway, place your affected hand inside the doorway so that your palm is facing the door jam.  Keeping your elbow at your side and bend to 90 degrees, slowly press your hand into the door jam.  Do not move shoulder or allow elbow to leave your side.     4) Shoulder External Rotation Standing in a doorway or to the side of a wall, Place the affected hand/wrist against the wall.  Keeping your elbow bent at 90 degrees and close to your body, press your hand/wrist outward against the wall.  Do not move your shoulder or elbow.          5) Shoulder Adduction Standing just behind a doorway, place a towel between the inside of your elbow and the doorframe. Keeping your elbow bent at 90 degrees and close to your side, gently press your elbow into the doorframe.  Do not move shoulder.     6) Shoulder Abduction Standing to the side of a wall, place a towel between your arm and the wall.  Press your arm into the wall gently.  Do not move your shoulder.    

## 2017-10-24 NOTE — Therapy (Signed)
[place], [place], [address] Phone: [phone]   Fax:  [phone]  Occupational Therapy Treatment  Patient Details  Name: Lindsay Howe MRN: [id number] Date of Birth: [birth year] Referring Provider: Dr. Esmond Plants   Encounter Date: 10/24/2017  OT End of Session - 10/24/17 1141    Visit Number  7    Number of Visits  24    Date for OT Re-Evaluation  12/28/17 mini reassess on 10/29/17    Authorization Type  [employer] 60 visit limit    Authorization - Visit Number  7    Authorization - Number of Visits  60    OT Start Time  5631    OT Stop Time  1117    OT Time Calculation (min)  45 min    Activity Tolerance  Patient tolerated treatment well    Behavior During Therapy  [facility] for tasks assessed/performed       Past Medical History:  Diagnosis Date  . Depression   . GERD (gastroesophageal reflux disease)   . Hyperlipemia   . Insomnia   . Thyroid disease     Past Surgical History:  Procedure Laterality Date  . BLADDER REPAIR    . BREAST ENHANCEMENT SURGERY    . CARPAL TUNNEL RELEASE     left  . COLONOSCOPY WITH ESOPHAGOGASTRODUODENOSCOPY (EGD)  07/05/2012   Procedure: COLONOSCOPY WITH ESOPHAGOGASTRODUODENOSCOPY (EGD);  Surgeon: Rogene Houston, MD;  [facility];  Service: Endoscopy;  Laterality: N/A;  1200  . THYROIDECTOMY, PARTIAL    . TUBAL LIGATION      There were no vitals filed for this visit.  Subjective Assessment - 10/24/17 1035    Subjective   S: I haven't been wearing the sling a lot.     Currently in Pain?  No/denies         [facility] OT Assessment - 10/24/17 1033      Assessment   Medical Diagnosis  S/P Left RCR       Precautions   Precautions  Shoulder    Type of Shoulder Precautions  4/1-4/29:  pendulum, PROM:  flexion to 90, internal and external rotation, NO ABDUCTION, wrist and elbow A/ROM, isometrics, wand external and internal rotation.  4/30-5/27:  progress PROM to  full, include seated P/ROM, may being AA/ROM when patient has full pain free arc, continue er/ir with wand. 6/3-6/24 progress to a/rom, light resistive exercises      Shoulder Interventions  Shoulder sling/immobilizer               OT Treatments/Exercises (OP) - 10/24/17 1035      Exercises   Exercises  Shoulder      Shoulder Exercises: Supine   Protraction  PROM;AAROM;10 reps    Horizontal ABduction  -- no abduction    External Rotation  PROM;10 reps;AAROM;12 reps    Internal Rotation  PROM;10 reps;AAROM;12 reps    Flexion  PROM;10 reps    ABduction  -- no abduction      Shoulder Exercises: Seated   Extension  AROM;15 reps    Row  AROM;15 reps      Shoulder Exercises: Pulleys   Flexion  1 minute      Shoulder Exercises: ROM/Strengthening   Thumb Tacks  low level 1'    Prot/Ret//Elev/Dep  1'      Shoulder Exercises: Isometric Strengthening   Flexion  -- standing 3x10"    Extension  -- standing 3x10"  External Rotation  -- standing 3x10"    Internal Rotation  -- standing 3x10"    ADduction  -- standing 3x10"      Manual Therapy   Manual Therapy  Soft tissue mobilization    Manual therapy comments  manual therapy interventions completed seperately from all other interventions this date    Soft tissue mobilization  soft tissue mobilization to decrease tightness and restrictions and imrpove main free mobility in left shoulder region              OT Education - 10/24/17 1100    Education provided  Yes    Education Details  standing isometric exercises    Person(s) Educated  Patient    Methods  Explanation;Demonstration;Handout    Comprehension  Verbalized understanding;Returned demonstration       OT Short Term Goals - 10/04/17 1148      OT SHORT TERM GOAL #1   Title  Patient will be educated on a HEP for improved left shoulder mobility.      Time  6    Period  Weeks    Status  On-going      OT SHORT TERM GOAL #2   Title  Patient will increase  left shouler P/ROM to [facility] in order to improve ease with donning and doffing shirts and coats.    Time  6    Period  Weeks    Status  On-going      OT SHORT TERM GOAL #3   Title  Patient will increase left shoulder strength to 3/5 or better for improved ability to lift bags of groceries.     Time  6    Period  Weeks    Status  On-going      OT SHORT TERM GOAL #4   Title  Patient will decrease pain to 5/10 or better in left shoulder during ADL tasks.     Time  6    Period  Weeks    Status  On-going      OT SHORT TERM GOAL #5   Title  Patient will decrease fascial restrictions from mod-max to moderate for improved mobility required for ADL completion.     Time  6    Period  Weeks    Status  On-going        OT Long Term Goals - 10/04/17 1148      OT LONG TERM GOAL #1   Title  Patient will return to prior level of independence with all B/IADLs and leisure actitivies using left arm actively with all tasks.     Time  12    Period  Weeks    Status  On-going      OT LONG TERM GOAL #2   Title  Patient will improve left shoulder A/ROM to WNL for improved abiilty to reach into overhead cabinets, behind back to fasten bra.     Time  12    Period  Weeks    Status  On-going      OT LONG TERM GOAL #3   Title  Patient will increase left shoulder strength to 5/5 in order to lift up her grandson.     Time  12    Period  Weeks    Status  On-going      OT LONG TERM GOAL #4   Title  Patient will decrease left shoulder pain to 2/10 or better when reaching overhead or behind her back.     Time  12    Period  Weeks    Status  On-going      OT LONG TERM GOAL #5   Title  Patient will decrease fascial restrictions to minimal or better in her left shoulder for improved mobility required for ADL completion.     Time  12    Period  Weeks    Status  On-going            Plan - 10/24/17 1142    Clinical Impression Statement  A: Progressed to ROM as tolerated per protocol, continued  with manual therapy to address fascial restrictions. Added isometrics in standing and updated for HEP, also added pulley in flexion. Verbal cuing for form and technique during exercises.     Plan  P: continue to follow protocol working towards increasing ROM, follow up on HEP    Consulted and Agree with Plan of Care  Patient       Patient will benefit from skilled therapeutic intervention in order to improve the following deficits and impairments:  Increased muscle spasms, Decreased scar mobility, Decreased strength, Pain, Decreased range of motion, Increased fascial restrictions, Impaired UE functional use  Visit Diagnosis: Stiffness of left shoulder, not elsewhere classified  Acute pain of left shoulder  Other symptoms and signs involving the musculoskeletal system    Problem List Patient Active Problem List   Diagnosis Date Noted  . Reflux esophagitis 10/15/2012  . Hypothyroidism 06/06/2012  . GERD (gastroesophageal reflux disease) 06/06/2012  . Dysphagia 06/06/2012   Guadelupe Sabin, OTR/L  [phone] 10/24/2017, 11:44 AM  [place], [place], [address] Phone: [phone]   Fax:  [phone]  Name: MARYCLARE NYDAM MRN: [id number] Date of Birth: 04/21/1961

## 2017-10-26 ENCOUNTER — Ambulatory Visit (HOSPITAL_COMMUNITY): Payer: 59 | Attending: Orthopedic Surgery

## 2017-10-26 DIAGNOSIS — M25612 Stiffness of left shoulder, not elsewhere classified: Secondary | ICD-10-CM | POA: Diagnosis present

## 2017-10-26 DIAGNOSIS — M25512 Pain in left shoulder: Secondary | ICD-10-CM | POA: Diagnosis present

## 2017-10-26 DIAGNOSIS — R29898 Other symptoms and signs involving the musculoskeletal system: Secondary | ICD-10-CM | POA: Insufficient documentation

## 2017-10-26 NOTE — Therapy (Signed)
[place], [place], [address] Phone: [phone]   Fax:  [phone]  Occupational Therapy Treatment And mini reassessment  Patient Details  Name: Lindsay Howe MRN: [id number] Date of Birth: 1961/03/06 Referring Provider: Dr. Esmond Plants  Progress Note Reporting Period 09/29/17 to 10/26/17  See note below for Objective Data and Assessment of Progress/Goals.       Encounter Date: 10/26/2017  OT End of Session - 10/26/17 1141    Visit Number  8    Number of Visits  24    Date for OT Re-Evaluation  12/28/17    Authorization Type  [employer] 60 visit limit    Authorization - Visit Number  8    Authorization - Number of Visits  60    OT Start Time  1115    OT Stop Time  1200    OT Time Calculation (min)  45 min    Activity Tolerance  Patient tolerated treatment well    Behavior During Therapy  [facility] for tasks assessed/performed       Past Medical History:  Diagnosis Date  . Depression   . GERD (gastroesophageal reflux disease)   . Hyperlipemia   . Insomnia   . Thyroid disease     Past Surgical History:  Procedure Laterality Date  . BLADDER REPAIR    . BREAST ENHANCEMENT SURGERY    . CARPAL TUNNEL RELEASE     left  . COLONOSCOPY WITH ESOPHAGOGASTRODUODENOSCOPY (EGD)  07/05/2012   Procedure: COLONOSCOPY WITH ESOPHAGOGASTRODUODENOSCOPY (EGD);  Surgeon: Rogene Houston, MD;  [facility];  Service: Endoscopy;  Laterality: N/A;  1200  . THYROIDECTOMY, PARTIAL    . TUBAL LIGATION      There were no vitals filed for this visit.  Subjective Assessment - 10/26/17 1306    Subjective   S: I don't always wear my sling around the house.     Currently in Pain?  No/denies         [facility] OT Assessment - 10/26/17 1132      Assessment   Medical Diagnosis  S/P Left RCR       Precautions   Precautions  Shoulder    Type of Shoulder Precautions  4/1-4/29:  pendulum, PROM:  flexion to 90, internal and  external rotation, NO ABDUCTION, wrist and elbow A/ROM, isometrics, wand external and internal rotation.  4/30-5/27:  progress PROM to full, include seated P/ROM, may being AA/ROM when patient has full pain free arc, continue er/ir with wand. 6/3-6/24 progress to a/rom, light resistive exercises      Shoulder Interventions  Shoulder sling/immobilizer      AROM   Overall AROM   Unable to assess;Due to precautions      PROM   Overall PROM Comments  assessed in supine; external and internal rotation with shoulder adducted    PROM Assessment Site  Shoulder    Right/Left Shoulder  Left    Left Shoulder Flexion  143 Degrees previous: 69    Left Shoulder ABduction  180 Degrees previous: 30    Left Shoulder Internal Rotation  90 Degrees previous: 75    Left Shoulder External Rotation  55 Degrees previous: 0      Strength   Overall Strength Comments  Unable to assess due to protocol.               OT Treatments/Exercises (OP) - 10/26/17 1137      Exercises  Exercises  Shoulder      Shoulder Exercises: Supine   Protraction  PROM;AAROM;10 reps    Horizontal ABduction  PROM;AAROM;10 reps    External Rotation  PROM;10 reps;AAROM;12 reps    Internal Rotation  PROM;AAROM;12 reps    Flexion  PROM;AAROM;10 reps    ABduction  PROM;AAROM;10 reps      Shoulder Exercises: Pulleys   Flexion  1 minute    ABduction  1 minute      Shoulder Exercises: Therapy Ball   Flexion  20 reps    ABduction  20 reps      Shoulder Exercises: ROM/Strengthening   Wall Wash  1'      Manual Therapy   Manual Therapy  Soft tissue mobilization    Manual therapy comments  manual therapy interventions completed seperately from all other interventions this date    Soft tissue mobilization  soft tissue mobilization to decrease tightness and restrictions and imrpove main free mobility in left shoulder region                OT Short Term Goals - 10/26/17 1141      OT SHORT TERM GOAL #1   Title   Patient will be educated on a HEP for improved left shoulder mobility.      Time  6    Period  Weeks    Status  Achieved      OT SHORT TERM GOAL #2   Title  Patient will increase left shouler P/ROM to [facility] in order to improve ease with donning and doffing shirts and coats.    Time  6    Period  Weeks    Status  Achieved      OT SHORT TERM GOAL #3   Title  Patient will increase left shoulder strength to 3/5 or better for improved ability to lift bags of groceries.     Time  6    Period  Weeks    Status  On-going      OT SHORT TERM GOAL #4   Title  Patient will decrease pain to 5/10 or better in left shoulder during ADL tasks.     Time  6    Period  Weeks    Status  On-going      OT SHORT TERM GOAL #5   Title  Patient will decrease fascial restrictions from mod-max to moderate for improved mobility required for ADL completion.     Time  6    Period  Weeks    Status  Achieved        OT Long Term Goals - 10/04/17 1148      OT LONG TERM GOAL #1   Title  Patient will return to prior level of independence with all B/IADLs and leisure actitivies using left arm actively with all tasks.     Time  12    Period  Weeks    Status  On-going      OT LONG TERM GOAL #2   Title  Patient will improve left shoulder A/ROM to WNL for improved abiilty to reach into overhead cabinets, behind back to fasten bra.     Time  12    Period  Weeks    Status  On-going      OT LONG TERM GOAL #3   Title  Patient will increase left shoulder strength to 5/5 in order to lift up her grandson.     Time  12    Period  Weeks    Status  On-going      OT LONG TERM GOAL #4   Title  Patient will decrease left shoulder pain to 2/10 or better when reaching overhead or behind her back.     Time  12    Period  Weeks    Status  On-going      OT LONG TERM GOAL #5   Title  Patient will decrease fascial restrictions to minimal or better in her left shoulder for improved mobility required for ADL completion.      Time  12    Period  Weeks    Status  On-going            Plan - 10/26/17 1306    Clinical Impression Statement  A: Progressed to full P/ROM this session while adding abduction per protocol. Pt was able to complete supine AA/ROM. Min fascial restrictions noted in anterior portion of shoulder while manual therapy techniques completed to address. Abductor pillow removed from sling. patient was educated that starting on Tuesday next week she may begin to wean from sling. VC for form and technique were needed. Completed min reassessment to look at patient's progress. She has increased her P/ROM from initial evaluation. She has met 3/5 STGs. Patient continues to have deficits with ROM, strength, and fascial restrictions.     Plan  P: Continue with protocol - week 5. Complete FOTO. Attempt shoulder level thumb tacks    Consulted and Agree with Plan of Care  Patient       Patient will benefit from skilled therapeutic intervention in order to improve the following deficits and impairments:  Increased muscle spasms, Decreased scar mobility, Decreased strength, Pain, Decreased range of motion, Increased fascial restrictions, Impaired UE functional use  Visit Diagnosis: Stiffness of left shoulder, not elsewhere classified  Acute pain of left shoulder  Other symptoms and signs involving the musculoskeletal system    Problem List Patient Active Problem List   Diagnosis Date Noted  . Reflux esophagitis 10/15/2012  . Hypothyroidism 06/06/2012  . GERD (gastroesophageal reflux disease) 06/06/2012  . Dysphagia 06/06/2012   Ailene Ravel, OTR/L,CBIS  [phone]  10/26/2017, 1:12 PM  [place] [address] [place], [place], [address] Phone: [phone]   Fax:  [phone]  Name: Lindsay Howe MRN: [id number] Date of Birth: May 27, 1961

## 2017-10-31 ENCOUNTER — Other Ambulatory Visit: Payer: Self-pay

## 2017-10-31 ENCOUNTER — Encounter (HOSPITAL_COMMUNITY): Payer: Self-pay

## 2017-10-31 ENCOUNTER — Ambulatory Visit (HOSPITAL_COMMUNITY): Payer: 59

## 2017-10-31 DIAGNOSIS — M25612 Stiffness of left shoulder, not elsewhere classified: Secondary | ICD-10-CM

## 2017-10-31 DIAGNOSIS — R29898 Other symptoms and signs involving the musculoskeletal system: Secondary | ICD-10-CM

## 2017-10-31 DIAGNOSIS — M25512 Pain in left shoulder: Secondary | ICD-10-CM

## 2017-10-31 NOTE — Therapy (Signed)
[place] [facility] [address] [place], [place], [address] Phone: [phone]   Fax:  [phone]  Occupational Therapy Treatment  Patient Details  Name: Lindsay Howe MRN: [id number] Date of Birth: 1960/12/20 Referring Provider: Dr. Malon Kindle   Encounter Date: 10/31/2017  OT End of Session - 10/31/17 1055    Visit Number  9    Number of Visits  24    Date for OT Re-Evaluation  12/28/17    Authorization Type  [employer] 60 visit limit    Authorization - Visit Number  9    Authorization - Number of Visits  60    OT Start Time  1015    OT Stop Time  1057    OT Time Calculation (min)  42 min    Activity Tolerance  Patient tolerated treatment well    Behavior During Therapy  [facility] for tasks assessed/performed       Past Medical History:  Diagnosis Date  . Depression   . GERD (gastroesophageal reflux disease)   . Hyperlipemia   . Insomnia   . Thyroid disease     Past Surgical History:  Procedure Laterality Date  . BLADDER REPAIR    . BREAST ENHANCEMENT SURGERY    . CARPAL TUNNEL RELEASE     left  . COLONOSCOPY WITH ESOPHAGOGASTRODUODENOSCOPY (EGD)  07/05/2012   Procedure: COLONOSCOPY WITH ESOPHAGOGASTRODUODENOSCOPY (EGD);  Surgeon: Malissa Hippo, MD;  [facility];  Service: Endoscopy;  Laterality: N/A;  1200  . THYROIDECTOMY, PARTIAL    . TUBAL LIGATION      There were no vitals filed for this visit.  Subjective Assessment - 10/31/17 1036    Subjective   S: Nothing new to report.     Currently in Pain?  No/denies         [facility] OT Assessment - 10/31/17 1037      Assessment   Medical Diagnosis  S/P Left RCR       Precautions   Precautions  Shoulder    Type of Shoulder Precautions  4/1-4/29:  pendulum, PROM:  flexion to 90, internal and external rotation, NO ABDUCTION, wrist and elbow A/ROM, isometrics, wand external and internal rotation.  4/30-5/27:  progress PROM to full, include seated P/ROM, may being AA/ROM  when patient has full pain free arc, continue er/ir with wand. 6/3-6/24 progress to a/rom, light resistive exercises      Shoulder Interventions  Shoulder sling/immobilizer      Observation/Other Assessments   Focus on Therapeutic Outcomes (FOTO)   53/100               OT Treatments/Exercises (OP) - 10/31/17 1037      Exercises   Exercises  Shoulder      Shoulder Exercises: Supine   Protraction  PROM;5 reps;AAROM;12 reps    Horizontal ABduction  PROM;5 reps;AAROM;12 reps    External Rotation  PROM;5 reps;AAROM;12 reps    Internal Rotation  PROM;5 reps;AAROM;12 reps    Flexion  PROM;5 reps;AAROM;12 reps    ABduction  PROM;5 reps;AAROM;12 reps      Shoulder Exercises: Standing   Protraction  AAROM;10 reps    External Rotation  AAROM;10 reps    Internal Rotation  AAROM;10 reps    Flexion  AAROM;10 reps    Flexion Limitations  to shoulder height      Shoulder Exercises: Pulleys   Flexion  1 minute    ABduction  1 minute  Shoulder Exercises: ROM/Strengthening   Wall Wash  1'    Thumb Tacks  1'    Proximal Shoulder Strengthening, Supine  10X one rest breaks    Other ROM/Strengthening Exercises  PVC pipe slide; 10X flexion      Manual Therapy   Manual Therapy  Soft tissue mobilization    Manual therapy comments  manual therapy interventions completed seperately from all other interventions this date    Soft tissue mobilization  soft tissue mobilization to decrease tightness and restrictions and imrpove main free mobility in left shoulder region                OT Short Term Goals - 10/31/17 1055      OT SHORT TERM GOAL #1   Title  Patient will be educated on a HEP for improved left shoulder mobility.      Time  6    Period  Weeks      OT SHORT TERM GOAL #2   Title  Patient will increase left shouler P/ROM to [facility] in order to improve ease with donning and doffing shirts and coats.    Time  6    Period  Weeks      OT SHORT TERM GOAL #3   Title   Patient will increase left shoulder strength to 3/5 or better for improved ability to lift bags of groceries.     Time  6    Period  Weeks    Status  On-going      OT SHORT TERM GOAL #4   Title  Patient will decrease pain to 5/10 or better in left shoulder during ADL tasks.     Time  6    Period  Weeks    Status  On-going      OT SHORT TERM GOAL #5   Title  Patient will decrease fascial restrictions from mod-max to moderate for improved mobility required for ADL completion.     Time  6    Period  Weeks        OT Long Term Goals - 10/04/17 1148      OT LONG TERM GOAL #1   Title  Patient will return to prior level of independence with all B/IADLs and leisure actitivies using left arm actively with all tasks.     Time  12    Period  Weeks    Status  On-going      OT LONG TERM GOAL #2   Title  Patient will improve left shoulder A/ROM to WNL for improved abiilty to reach into overhead cabinets, behind back to fasten bra.     Time  12    Period  Weeks    Status  On-going      OT LONG TERM GOAL #3   Title  Patient will increase left shoulder strength to 5/5 in order to lift up her grandson.     Time  12    Period  Weeks    Status  On-going      OT LONG TERM GOAL #4   Title  Patient will decrease left shoulder pain to 2/10 or better when reaching overhead or behind her back.     Time  12    Period  Weeks    Status  On-going      OT LONG TERM GOAL #5   Title  Patient will decrease fascial restrictions to minimal or better in her left shoulder for improved mobility required for ADL  completion.     Time  12    Period  Weeks    Status  On-going            Plan - 10/31/17 1055    Clinical Impression Statement  A: Patient was able to complete standing AA/ROM for protraction, flexion, and IR/er. Added pvc pipe slide to increase functional mobility in order to prepare for reaching tasks. VC for form and technique. Minimal fascial restrictions noted in upper trapezius region  and manual technique were completed to address.     Plan  P: Continue with week 5 protocol. Follow up on MD appointment. Work towards being able to complete standing AA/ROM as patient's pain decreases and form and technique increase.    Consulted and Agree with Plan of Care  Patient       Patient will benefit from skilled therapeutic intervention in order to improve the following deficits and impairments:  Increased muscle spasms, Decreased scar mobility, Decreased strength, Pain, Decreased range of motion, Increased fascial restrictions, Impaired UE functional use  Visit Diagnosis: Stiffness of left shoulder, not elsewhere classified  Acute pain of left shoulder  Other symptoms and signs involving the musculoskeletal system    Problem List Patient Active Problem List   Diagnosis Date Noted  . Reflux esophagitis 10/15/2012  . Hypothyroidism 06/06/2012  . GERD (gastroesophageal reflux disease) 06/06/2012  . Dysphagia 06/06/2012   Limmie Patricia, OTR/L,CBIS  [phone]  10/31/2017, 11:01 AM  [facility] [address] [place], [place], [address] Phone: [phone]   Fax:  [phone]  Name: Lindsay Howe MRN: [id number] Date of Birth: [birth year]

## 2017-11-02 ENCOUNTER — Ambulatory Visit (HOSPITAL_COMMUNITY): Payer: 59 | Admitting: Occupational Therapy

## 2017-11-07 ENCOUNTER — Other Ambulatory Visit: Payer: Self-pay

## 2017-11-07 ENCOUNTER — Ambulatory Visit (HOSPITAL_COMMUNITY): Payer: 59

## 2017-11-07 ENCOUNTER — Encounter (HOSPITAL_COMMUNITY): Payer: Self-pay

## 2017-11-07 DIAGNOSIS — R29898 Other symptoms and signs involving the musculoskeletal system: Secondary | ICD-10-CM

## 2017-11-07 DIAGNOSIS — M25612 Stiffness of left shoulder, not elsewhere classified: Secondary | ICD-10-CM | POA: Diagnosis not present

## 2017-11-07 DIAGNOSIS — M25512 Pain in left shoulder: Secondary | ICD-10-CM

## 2017-11-07 NOTE — Therapy (Signed)
[place] [facility] [address] [place], [place], [address] Phone: [phone]   Fax:  [phone]  Occupational Therapy Treatment  Patient Details  Name: Lindsay Howe MRN: [id number] Date of Birth: 1961-05-28 Referring Provider: Dr. Malon Kindle   Encounter Date: 11/07/2017  OT End of Session - 11/07/17 1109    Visit Number  10    Number of Visits  24    Date for OT Re-Evaluation  12/28/17    Authorization Type  UHC 60 visit limit    Authorization - Visit Number  10    Authorization - Number of Visits  60    OT Start Time  1033    OT Stop Time  1115    OT Time Calculation (min)  42 min    Activity Tolerance  Patient tolerated treatment well    Behavior During Therapy  [facility] for tasks assessed/performed       Past Medical History:  Diagnosis Date  . Depression   . GERD (gastroesophageal reflux disease)   . Hyperlipemia   . Insomnia   . Thyroid disease     Past Surgical History:  Procedure Laterality Date  . BLADDER REPAIR    . BREAST ENHANCEMENT SURGERY    . CARPAL TUNNEL RELEASE     left  . COLONOSCOPY WITH ESOPHAGOGASTRODUODENOSCOPY (EGD)  07/05/2012   Procedure: COLONOSCOPY WITH ESOPHAGOGASTRODUODENOSCOPY (EGD);  Surgeon: Malissa Hippo, MD;  [facility];  Service: Endoscopy;  Laterality: N/A;  1200  . THYROIDECTOMY, PARTIAL    . TUBAL LIGATION      There were no vitals filed for this visit.  Subjective Assessment - 11/07/17 1050    Subjective   S: The doctor said I could go down to once a week.     Currently in Pain?  No/denies         [facility] OT Assessment - 11/07/17 1050      Assessment   Medical Diagnosis  S/P Left RCR       Precautions   Precautions  Shoulder    Type of Shoulder Precautions  4/1-4/29:  pendulum, PROM:  flexion to 90, internal and external rotation, NO ABDUCTION, wrist and elbow A/ROM, isometrics, wand external and internal rotation.  4/30-5/27:  progress PROM to full, include  seated P/ROM, may being AA/ROM when patient has full pain free arc, continue er/ir with wand. 6/3-6/24 progress to a/rom, light resistive exercises      Shoulder Interventions  Shoulder sling/immobilizer               OT Treatments/Exercises (OP) - 11/07/17 1050      Exercises   Exercises  Shoulder      Shoulder Exercises: Supine   Protraction  PROM;5 reps;AAROM;15 reps    Horizontal ABduction  PROM;5 reps;AAROM;15 reps    External Rotation  PROM;5 reps;AAROM;15 reps    Internal Rotation  PROM;5 reps;AAROM;15 reps    Flexion  PROM;5 reps;AAROM;15 reps    ABduction  PROM;5 reps;AAROM;15 reps      Shoulder Exercises: Standing   Protraction  AAROM;12 reps    Horizontal ABduction  AAROM;12 reps    External Rotation  AAROM;12 reps    Internal Rotation  AAROM;12 reps    Flexion  AAROM;12 reps    ABduction  AAROM;12 reps    Extension  Theraband;10 reps    Theraband Level (Shoulder Extension)  Level 2 (Red)    Row  Theraband;10 reps    Theraband  Level (Shoulder Row)  Level 2 (Red)      Shoulder Exercises: Pulleys   Flexion  1 minute    ABduction  1 minute      Shoulder Exercises: ROM/Strengthening   Wall Wash  1'    Proximal Shoulder Strengthening, Supine  10X no rest breaks    Proximal Shoulder Strengthening, Seated  10X no rest breaks      Manual Therapy   Manual Therapy  Soft tissue mobilization    Manual therapy comments  manual therapy interventions completed seperately from all other interventions this date    Soft tissue mobilization  soft tissue mobilization to decrease tightness and restrictions and imrpove main free mobility in left shoulder region              OT Education - 11/07/17 1105    Education provided  Yes    Education Details  standing AA/ROM shoulder exercises    Person(s) Educated  Patient    Methods  Explanation;Demonstration;Verbal cues;Handout    Comprehension  Returned demonstration;Verbalized understanding       OT Short Term  Goals - 10/31/17 1055      OT SHORT TERM GOAL #1   Title  Patient will be educated on a HEP for improved left shoulder mobility.      Time  6    Period  Weeks      OT SHORT TERM GOAL #2   Title  Patient will increase left shouler P/ROM to [facility] in order to improve ease with donning and doffing shirts and coats.    Time  6    Period  Weeks      OT SHORT TERM GOAL #3   Title  Patient will increase left shoulder strength to 3/5 or better for improved ability to lift bags of groceries.     Time  6    Period  Weeks    Status  On-going      OT SHORT TERM GOAL #4   Title  Patient will decrease pain to 5/10 or better in left shoulder during ADL tasks.     Time  6    Period  Weeks    Status  On-going      OT SHORT TERM GOAL #5   Title  Patient will decrease fascial restrictions from mod-max to moderate for improved mobility required for ADL completion.     Time  6    Period  Weeks        OT Long Term Goals - 10/04/17 1148      OT LONG TERM GOAL #1   Title  Patient will return to prior level of independence with all B/IADLs and leisure actitivies using left arm actively with all tasks.     Time  12    Period  Weeks    Status  On-going      OT LONG TERM GOAL #2   Title  Patient will improve left shoulder A/ROM to WNL for improved abiilty to reach into overhead cabinets, behind back to fasten bra.     Time  12    Period  Weeks    Status  On-going      OT LONG TERM GOAL #3   Title  Patient will increase left shoulder strength to 5/5 in order to lift up her grandson.     Time  12    Period  Weeks    Status  On-going      OT LONG TERM GOAL #  4   Title  Patient will decrease left shoulder pain to 2/10 or better when reaching overhead or behind her back.     Time  12    Period  Weeks    Status  On-going      OT LONG TERM GOAL #5   Title  Patient will decrease fascial restrictions to minimal or better in her left shoulder for improved mobility required for ADL completion.      Time  12    Period  Weeks    Status  On-going            Plan - 11/07/17 1110    Clinical Impression Statement  A: Patient was downgraded to once a week per MD recommendation. HEP was updated to include only AA/ROM. Progressed to extension and row with red theraband. Patient required VC for form and technique.     Plan  P: Follow up on HEP. Add scapular strengthening with for abduction. Provided scapular strengthening for HEP with red band.     Consulted and Agree with Plan of Care  Patient       Patient will benefit from skilled therapeutic intervention in order to improve the following deficits and impairments:  Increased muscle spasms, Decreased scar mobility, Decreased strength, Pain, Decreased range of motion, Increased fascial restrictions, Impaired UE functional use  Visit Diagnosis: Stiffness of left shoulder, not elsewhere classified  Acute pain of left shoulder  Other symptoms and signs involving the musculoskeletal system    Problem List Patient Active Problem List   Diagnosis Date Noted  . Reflux esophagitis 10/15/2012  . Hypothyroidism 06/06/2012  . GERD (gastroesophageal reflux disease) 06/06/2012  . Dysphagia 06/06/2012   Limmie Patricia, OTR/L,CBIS  [phone]  11/07/2017, 11:44 AM  [place] [facility] [address] [place], [place], [address] Phone: [phone]   Fax:  [phone]  Name: VERNEE BAINES MRN: [id number] Date of Birth: [birth year]

## 2017-11-07 NOTE — Patient Instructions (Signed)
Access Code: [email or web address]  URL: [email or web address]  Date: 11/07/2017  Prepared by: Limmie Patricia   Exercises  Seated/standing Shoulder protaction with dowel - 10-15 reps - 1 sets - 2x daily - 7x weekly  Standing/Seated Shoulder Flexion AAROM with Dowel - 10-15 reps - 1 sets - 2x daily - 7x weekly  Seated/Standing External rotation AA/ROM with Dowel - 10-15 reps - 1 sets - 2x daily - 7x weekly  Standing/Seated Shoulder Abduction AA/ROM with dowel - 10-15 reps - 1 sets - 2x daily - 7x weekly  Seated/Standing horizontal abduction with dowel - 10-15 reps - 1 sets - 2x daily - 7x weekly

## 2017-11-09 ENCOUNTER — Encounter (HOSPITAL_COMMUNITY): Payer: 59

## 2017-11-14 ENCOUNTER — Encounter (HOSPITAL_COMMUNITY): Payer: Self-pay | Admitting: Occupational Therapy

## 2017-11-14 ENCOUNTER — Ambulatory Visit (HOSPITAL_COMMUNITY): Payer: 59 | Admitting: Occupational Therapy

## 2017-11-14 DIAGNOSIS — R29898 Other symptoms and signs involving the musculoskeletal system: Secondary | ICD-10-CM

## 2017-11-14 DIAGNOSIS — M25612 Stiffness of left shoulder, not elsewhere classified: Secondary | ICD-10-CM | POA: Diagnosis not present

## 2017-11-14 DIAGNOSIS — M25512 Pain in left shoulder: Secondary | ICD-10-CM

## 2017-11-14 NOTE — Therapy (Signed)
[place] [facility] [address] [place], [place], [address] Phone: [phone]   Fax:  [phone]  Occupational Therapy Treatment  Patient Details  Name: Lindsay Howe MRN: [id number] Date of Birth: 01/12/1961 Referring Provider: Dr. Malon Kindle   Encounter Date: 11/14/2017  OT End of Session - 11/14/17 1117    Visit Number  11    Number of Visits  24    Date for OT Re-Evaluation  12/28/17    Authorization Type  UHC 60 visit limit    Authorization - Visit Number  11    Authorization - Number of Visits  60    OT Start Time  1035    OT Stop Time  1115    OT Time Calculation (min)  40 min    Activity Tolerance  Patient tolerated treatment well    Behavior During Therapy  [facility] for tasks assessed/performed       Past Medical History:  Diagnosis Date  . Depression   . GERD (gastroesophageal reflux disease)   . Hyperlipemia   . Insomnia   . Thyroid disease     Past Surgical History:  Procedure Laterality Date  . BLADDER REPAIR    . BREAST ENHANCEMENT SURGERY    . CARPAL TUNNEL RELEASE     left  . COLONOSCOPY WITH ESOPHAGOGASTRODUODENOSCOPY (EGD)  07/05/2012   Procedure: COLONOSCOPY WITH ESOPHAGOGASTRODUODENOSCOPY (EGD);  Surgeon: Malissa Hippo, MD;  [facility];  Service: Endoscopy;  Laterality: N/A;  1200  . THYROIDECTOMY, PARTIAL    . TUBAL LIGATION      There were no vitals filed for this visit.  Subjective Assessment - 11/14/17 1033    Subjective   S: It's been a bad week.     Currently in Pain?  Yes    Pain Score  5     Pain Location  Shoulder    Pain Orientation  Left    Pain Descriptors / Indicators  Sore    Pain Type  Acute pain    Pain Radiating Towards  n/a    Pain Onset  More than a month ago    Pain Frequency  Constant    Aggravating Factors   movement    Pain Relieving Factors  ice and medication    Effect of Pain on Daily Activities  min/mod effect on ADLs    Multiple Pain Sites  No          [facility] OT Assessment - 11/14/17 1032      Assessment   Medical Diagnosis  S/P Left RCR       Precautions   Precautions  Shoulder    Type of Shoulder Precautions  4/1-4/29:  pendulum, PROM:  flexion to 90, internal and external rotation, NO ABDUCTION, wrist and elbow A/ROM, isometrics, wand external and internal rotation.  4/30-5/27:  progress PROM to full, include seated P/ROM, may being AA/ROM when patient has full pain free arc, continue er/ir with wand. 6/3-6/24 progress to a/rom, light resistive exercises      Shoulder Interventions  Shoulder sling/immobilizer               OT Treatments/Exercises (OP) - 11/14/17 1034      Exercises   Exercises  Shoulder      Shoulder Exercises: Supine   Protraction  PROM;5 reps;AAROM;15 reps    Horizontal ABduction  PROM;5 reps;AAROM;15 reps    External Rotation  PROM;5 reps;AAROM;15 reps    Internal Rotation  PROM;5  reps;AAROM;15 reps    Flexion  PROM;5 reps;AAROM;15 reps    ABduction  PROM;5 reps;AAROM;15 reps      Shoulder Exercises: Standing   Protraction  AAROM;12 reps    Horizontal ABduction  AAROM;12 reps    External Rotation  AAROM;12 reps    Internal Rotation  AAROM;12 reps    Flexion  AAROM;12 reps    ABduction  AAROM;12 reps    Extension  Theraband;10 reps    Theraband Level (Shoulder Extension)  Level 2 (Red)    Row  Theraband;10 reps    Theraband Level (Shoulder Row)  Level 2 (Red)    Retraction  Theraband;5 reps    Theraband Level (Shoulder Retraction)  Level 2 (Red)      Shoulder Exercises: Pulleys   Flexion  1 minute      Shoulder Exercises: ROM/Strengthening   Proximal Shoulder Strengthening, Supine  10X no rest breaks    Proximal Shoulder Strengthening, Seated  10X no rest breaks    Other ROM/Strengthening Exercises  PVC pipe slide; 15X flexion      Manual Therapy   Manual Therapy  Soft tissue mobilization    Manual therapy comments  manual therapy interventions completed seperately from all  other interventions this date    Soft tissue mobilization  soft tissue mobilization to decrease tightness and restrictions and imrpove main free mobility in left shoulder region              OT Education - 11/14/17 1104    Education provided  Yes    Education Details  scapular theraband    Person(s) Educated  Patient    Methods  Explanation;Demonstration;Handout    Comprehension  Verbalized understanding;Returned demonstration       OT Short Term Goals - 10/31/17 1055      OT SHORT TERM GOAL #1   Title  Patient will be educated on a HEP for improved left shoulder mobility.      Time  6    Period  Weeks      OT SHORT TERM GOAL #2   Title  Patient will increase left shouler P/ROM to [facility] in order to improve ease with donning and doffing shirts and coats.    Time  6    Period  Weeks      OT SHORT TERM GOAL #3   Title  Patient will increase left shoulder strength to 3/5 or better for improved ability to lift bags of groceries.     Time  6    Period  Weeks    Status  On-going      OT SHORT TERM GOAL #4   Title  Patient will decrease pain to 5/10 or better in left shoulder during ADL tasks.     Time  6    Period  Weeks    Status  On-going      OT SHORT TERM GOAL #5   Title  Patient will decrease fascial restrictions from mod-max to moderate for improved mobility required for ADL completion.     Time  6    Period  Weeks        OT Long Term Goals - 10/04/17 1148      OT LONG TERM GOAL #1   Title  Patient will return to prior level of independence with all B/IADLs and leisure actitivies using left arm actively with all tasks.     Time  12    Period  Weeks    Status  On-going      OT LONG TERM GOAL #2   Title  Patient will improve left shoulder A/ROM to WNL for improved abiilty to reach into overhead cabinets, behind back to fasten bra.     Time  12    Period  Weeks    Status  On-going      OT LONG TERM GOAL #3   Title  Patient will increase left shoulder  strength to 5/5 in order to lift up her grandson.     Time  12    Period  Weeks    Status  On-going      OT LONG TERM GOAL #4   Title  Patient will decrease left shoulder pain to 2/10 or better when reaching overhead or behind her back.     Time  12    Period  Weeks    Status  On-going      OT LONG TERM GOAL #5   Title  Patient will decrease fascial restrictions to minimal or better in her left shoulder for improved mobility required for ADL completion.     Time  12    Period  Weeks    Status  On-going            Plan - 11/14/17 1117    Clinical Impression Statement  A: Pt reporting soreness since last session, manual techniques completed to address fascial restrictions causing increased pain and limiting ROM. Pt reports HEP is going well, added scapular theraband to HEP this session. Pt with moderate weakness at 50% range resulting in mod/max difficulty with form and completion of proximal shoulder strengthening today. Verbal cuing for form and technique during session.     Plan  P: Follow up on scapular theraband HEP. Continue to work on improving ROM and scapular stability       Patient will benefit from skilled therapeutic intervention in order to improve the following deficits and impairments:  Increased muscle spasms, Decreased scar mobility, Decreased strength, Pain, Decreased range of motion, Increased fascial restrictions, Impaired UE functional use  Visit Diagnosis: Stiffness of left shoulder, not elsewhere classified  Acute pain of left shoulder  Other symptoms and signs involving the musculoskeletal system    Problem List Patient Active Problem List   Diagnosis Date Noted  . Reflux esophagitis 10/15/2012  . Hypothyroidism 06/06/2012  . GERD (gastroesophageal reflux disease) 06/06/2012  . Dysphagia 06/06/2012   Ezra Sites, OTR/L  [phone] 11/14/2017, 11:20 AM  [facility] [address] [place],  [place], [address] Phone: [phone]   Fax:  [phone]  Name: CYNDE MENARD MRN: [id number] Date of Birth: [birth year]

## 2017-11-14 NOTE — Patient Instructions (Signed)

## 2017-11-16 ENCOUNTER — Encounter (HOSPITAL_COMMUNITY): Payer: 59

## 2017-11-21 ENCOUNTER — Encounter (HOSPITAL_COMMUNITY): Payer: Self-pay | Admitting: Occupational Therapy

## 2017-11-21 ENCOUNTER — Ambulatory Visit (HOSPITAL_COMMUNITY): Payer: 59 | Admitting: Occupational Therapy

## 2017-11-21 DIAGNOSIS — M25612 Stiffness of left shoulder, not elsewhere classified: Secondary | ICD-10-CM

## 2017-11-21 DIAGNOSIS — R29898 Other symptoms and signs involving the musculoskeletal system: Secondary | ICD-10-CM

## 2017-11-21 DIAGNOSIS — M25512 Pain in left shoulder: Secondary | ICD-10-CM

## 2017-11-21 NOTE — Therapy (Signed)
[place], [place], [address] Phone: [phone]   Fax:  [phone]  Occupational Therapy Treatment  Patient Details  Name: Lindsay Howe MRN: [id number] Date of Birth: 06/02/1961 Referring Provider: Dr. Esmond Plants   Encounter Date: 11/21/2017  OT End of Session - 11/21/17 1111    Visit Number  12    Number of Visits  24    Date for OT Re-Evaluation  12/28/17    Authorization Type  [employer] 60 visit limit    Authorization - Visit Number  12    Authorization - Number of Visits  60    OT Start Time  7919    OT Stop Time  1110    OT Time Calculation (min)  38 min    Activity Tolerance  Patient tolerated treatment well    Behavior During Therapy  [facility] for tasks assessed/performed       Past Medical History:  Diagnosis Date  . Depression   . GERD (gastroesophageal reflux disease)   . Hyperlipemia   . Insomnia   . Thyroid disease     Past Surgical History:  Procedure Laterality Date  . BLADDER REPAIR    . BREAST ENHANCEMENT SURGERY    . CARPAL TUNNEL RELEASE     left  . COLONOSCOPY WITH ESOPHAGOGASTRODUODENOSCOPY (EGD)  07/05/2012   Procedure: COLONOSCOPY WITH ESOPHAGOGASTRODUODENOSCOPY (EGD);  Surgeon: Rogene Houston, MD;  [facility];  Service: Endoscopy;  Laterality: N/A;  1200  . THYROIDECTOMY, PARTIAL    . TUBAL LIGATION      There were no vitals filed for this visit.  Subjective Assessment - 11/21/17 1033    Subjective   S: When I go to the side my arm pops a lot. (er)    Currently in Pain?  Yes    Pain Score  2     Pain Location  Shoulder    Pain Orientation  Left    Pain Descriptors / Indicators  Sore    Pain Type  Acute pain    Pain Radiating Towards  n/a    Pain Onset  More than a month ago    Pain Frequency  Constant    Aggravating Factors   movement    Pain Relieving Factors  heat, medication    Effect of Pain on Daily Activities  min/mod effect on ADLs    Multiple Pain  Sites  No         [facility] OT Assessment - 11/21/17 1032      Assessment   Medical Diagnosis  S/P Left RCR       Precautions   Precautions  Shoulder    Type of Shoulder Precautions  4/1-4/29:  pendulum, PROM:  flexion to 90, internal and external rotation, NO ABDUCTION, wrist and elbow A/ROM, isometrics, wand external and internal rotation.  4/30-5/27:  progress PROM to full, include seated P/ROM, may being AA/ROM when patient has full pain free arc, continue er/ir with wand. 6/3-6/24 progress to a/rom, light resistive exercises      Shoulder Interventions  Shoulder sling/immobilizer               OT Treatments/Exercises (OP) - 11/21/17 1036      Exercises   Exercises  Shoulder      Shoulder Exercises: Supine   Protraction  PROM;5 reps;AAROM;15 reps    Horizontal ABduction  PROM;5 reps;AAROM;15 reps    External Rotation  PROM;5 reps;AAROM;15 reps  Internal Rotation  PROM;5 reps;AAROM;15 reps    Flexion  PROM;5 reps;AAROM;15 reps    ABduction  PROM;5 reps;AAROM;15 reps      Shoulder Exercises: Standing   Protraction  AAROM;12 reps    Horizontal ABduction  AAROM;12 reps    External Rotation  AAROM;12 reps    Internal Rotation  AAROM;12 reps    Flexion  AAROM;12 reps    ABduction  AAROM;12 reps    Extension  Theraband;12 reps    Theraband Level (Shoulder Extension)  Level 2 (Red)    Row  Theraband;12 reps    Theraband Level (Shoulder Row)  Level 2 (Red)    Retraction  Theraband;12 reps    Theraband Level (Shoulder Retraction)  Level 2 (Red)      Shoulder Exercises: Pulleys   Flexion  2 minutes      Shoulder Exercises: ROM/Strengthening   Proximal Shoulder Strengthening, Supine  10X no rest breaks    Proximal Shoulder Strengthening, Seated  10X no rest breaks    Prot/Ret//Elev/Dep  1'      Manual Therapy   Manual Therapy  Soft tissue mobilization    Manual therapy comments  manual therapy interventions completed seperately from all other interventions this  date    Soft tissue mobilization  soft tissue mobilization to decrease tightness and restrictions and imrpove main free mobility in left shoulder region                OT Short Term Goals - 10/31/17 1055      OT SHORT TERM GOAL #1   Title  Patient will be educated on a HEP for improved left shoulder mobility.      Time  6    Period  Weeks      OT SHORT TERM GOAL #2   Title  Patient will increase left shouler P/ROM to [facility] in order to improve ease with donning and doffing shirts and coats.    Time  6    Period  Weeks      OT SHORT TERM GOAL #3   Title  Patient will increase left shoulder strength to 3/5 or better for improved ability to lift bags of groceries.     Time  6    Period  Weeks    Status  On-going      OT SHORT TERM GOAL #4   Title  Patient will decrease pain to 5/10 or better in left shoulder during ADL tasks.     Time  6    Period  Weeks    Status  On-going      OT SHORT TERM GOAL #5   Title  Patient will decrease fascial restrictions from mod-max to moderate for improved mobility required for ADL completion.     Time  6    Period  Weeks        OT Long Term Goals - 10/04/17 1148      OT LONG TERM GOAL #1   Title  Patient will return to prior level of independence with all B/IADLs and leisure actitivies using left arm actively with all tasks.     Time  12    Period  Weeks    Status  On-going      OT LONG TERM GOAL #2   Title  Patient will improve left shoulder A/ROM to WNL for improved abiilty to reach into overhead cabinets, behind back to fasten bra.     Time  12    Period  Weeks    Status  On-going      OT LONG TERM GOAL #3   Title  Patient will increase left shoulder strength to 5/5 in order to lift up her grandson.     Time  12    Period  Weeks    Status  On-going      OT LONG TERM GOAL #4   Title  Patient will decrease left shoulder pain to 2/10 or better when reaching overhead or behind her back.     Time  12    Period  Weeks     Status  On-going      OT LONG TERM GOAL #5   Title  Patient will decrease fascial restrictions to minimal or better in her left shoulder for improved mobility required for ADL completion.     Time  12    Period  Weeks    Status  On-going            Plan - 11/21/17 1111    Clinical Impression Statement  A: Pt reports improvement in soreness since last session, HEP is going well however retraction is difficult with band. Continued with manual techniques to address fascial restrictions limiting ROM. Continued with AA/ROM with pt achieving greater than 75% range. Pt continues to have difficulty after 50% range. Verbal cuing for form and technique during exercises.     Plan  P: progress to A/ROM per protocol       Patient will benefit from skilled therapeutic intervention in order to improve the following deficits and impairments:  Increased muscle spasms, Decreased scar mobility, Decreased strength, Pain, Decreased range of motion, Increased fascial restrictions, Impaired UE functional use  Visit Diagnosis: Stiffness of left shoulder, not elsewhere classified  Acute pain of left shoulder  Other symptoms and signs involving the musculoskeletal system    Problem List Patient Active Problem List   Diagnosis Date Noted  . Reflux esophagitis 10/15/2012  . Hypothyroidism 06/06/2012  . GERD (gastroesophageal reflux disease) 06/06/2012  . Dysphagia 06/06/2012   Guadelupe Sabin, OTR/L  [phone] 11/21/2017, 11:15 AM  [place] [address] [place], [place], [address] Phone: [phone]   Fax:  [phone]  Name: Lindsay Howe MRN: [id number] Date of Birth: [birth year]

## 2017-11-23 ENCOUNTER — Encounter (HOSPITAL_COMMUNITY): Payer: 59

## 2017-11-27 ENCOUNTER — Telehealth (HOSPITAL_COMMUNITY): Payer: Self-pay | Admitting: Occupational Therapy

## 2017-11-27 NOTE — Telephone Encounter (Signed)
She has a sinus infection and will not be here 11/28/17. NF 11/27/17

## 2017-11-28 ENCOUNTER — Ambulatory Visit (HOSPITAL_COMMUNITY): Payer: 59 | Admitting: Occupational Therapy

## 2017-11-30 ENCOUNTER — Encounter (HOSPITAL_COMMUNITY): Payer: 59 | Admitting: Occupational Therapy

## 2017-12-05 ENCOUNTER — Ambulatory Visit (HOSPITAL_COMMUNITY): Payer: 59 | Attending: Orthopedic Surgery | Admitting: Occupational Therapy

## 2017-12-05 ENCOUNTER — Encounter (HOSPITAL_COMMUNITY): Payer: Self-pay | Admitting: Occupational Therapy

## 2017-12-05 DIAGNOSIS — R29898 Other symptoms and signs involving the musculoskeletal system: Secondary | ICD-10-CM

## 2017-12-05 DIAGNOSIS — M25612 Stiffness of left shoulder, not elsewhere classified: Secondary | ICD-10-CM

## 2017-12-05 DIAGNOSIS — M25512 Pain in left shoulder: Secondary | ICD-10-CM | POA: Diagnosis present

## 2017-12-05 NOTE — Therapy (Signed)
[place], [place], [address] Phone: [phone]   Fax:  [phone]  Occupational Therapy Treatment  Patient Details  Name: Lindsay Howe MRN: [id number] Date of Birth: July 19, 1960 Referring Provider: Dr. Esmond Plants   Encounter Date: 12/05/2017  OT End of Session - 12/05/17 1118    Visit Number  13    Number of Visits  24    Date for OT Re-Evaluation  12/28/17    Authorization Type  [employer] 60 visit limit    Authorization - Visit Number  13    Authorization - Number of Visits  5    OT Start Time  7253    OT Stop Time  1115    OT Time Calculation (min)  40 min    Activity Tolerance  Patient tolerated treatment well    Behavior During Therapy  [facility] for tasks assessed/performed       Past Medical History:  Diagnosis Date  . Depression   . GERD (gastroesophageal reflux disease)   . Hyperlipemia   . Insomnia   . Thyroid disease     Past Surgical History:  Procedure Laterality Date  . BLADDER REPAIR    . BREAST ENHANCEMENT SURGERY    . CARPAL TUNNEL RELEASE     left  . COLONOSCOPY WITH ESOPHAGOGASTRODUODENOSCOPY (EGD)  07/05/2012   Procedure: COLONOSCOPY WITH ESOPHAGOGASTRODUODENOSCOPY (EGD);  Surgeon: Rogene Houston, MD;  [facility];  Service: Endoscopy;  Laterality: N/A;  1200  . THYROIDECTOMY, PARTIAL    . TUBAL LIGATION      There were no vitals filed for this visit.  Subjective Assessment - 12/05/17 1037    Subjective   S: Sometimes my muscle gives me more of a fit than my shoulder does.     Currently in Pain?  Yes    Pain Score  1     Pain Location  Shoulder    Pain Orientation  Left    Pain Descriptors / Indicators  Sore    Pain Type  Acute pain    Pain Radiating Towards  n/a    Pain Onset  More than a month ago    Pain Frequency  Constant    Aggravating Factors   movement    Pain Relieving Factors  heat, pain medication    Effect of Pain on Daily Activities  min effect on  ADLs    Multiple Pain Sites  No         [facility] OT Assessment - 12/05/17 1118      Assessment   Medical Diagnosis  S/P Left RCR       Precautions   Precautions  Shoulder    Type of Shoulder Precautions  4/1-4/29:  pendulum, PROM:  flexion to 90, internal and external rotation, NO ABDUCTION, wrist and elbow A/ROM, isometrics, wand external and internal rotation.  4/30-5/27:  progress PROM to full, include seated P/ROM, may being AA/ROM when patient has full pain free arc, continue er/ir with wand. 6/3-6/24 progress to a/rom, light resistive exercises      Shoulder Interventions  Shoulder sling/immobilizer               OT Treatments/Exercises (OP) - 12/05/17 1039      Exercises   Exercises  Shoulder      Shoulder Exercises: Supine   Protraction  PROM;5 reps;AROM;12 reps    Horizontal ABduction  PROM;5 reps;AROM;12 reps    External Rotation  PROM;5 reps;AROM;12  reps    Internal Rotation  PROM;5 reps;AROM;12 reps    Flexion  PROM;5 reps;AROM;12 reps    ABduction  PROM;5 reps;AROM;12 reps      Shoulder Exercises: Standing   Protraction  AROM;12 reps    Horizontal ABduction  AAROM;12 reps    External Rotation  AROM;12 reps    Internal Rotation  AROM;12 reps    Flexion  AROM;12 reps    ABduction  AROM;12 reps    Extension  Theraband;12 reps    Theraband Level (Shoulder Extension)  Level 2 (Red)    Row  Theraband;12 reps    Theraband Level (Shoulder Row)  Level 2 (Red)    Retraction  Theraband;12 reps    Theraband Level (Shoulder Retraction)  Level 2 (Red)      Shoulder Exercises: ROM/Strengthening   UBE (Upper Arm Bike)  Level 1 2' forward 2' reverse Pace: 5.5    Proximal Shoulder Strengthening, Supine  12X no rest breaks    Proximal Shoulder Strengthening, Seated  12X no rest breaks    Prot/Ret//Elev/Dep  1'      Functional Reaching Activities   High Level  Pt completed functional reaching activity placing 10 cones on top shelf of cabinet in flexion, removing in  abduction. Min difficulty      Manual Therapy   Manual Therapy  Soft tissue mobilization    Manual therapy comments  manual therapy interventions completed seperately from all other interventions this date    Soft tissue mobilization  soft tissue mobilization to decrease tightness and restrictions and imrpove main free mobility in left shoulder region              OT Education - 12/05/17 1102    Education provided  Yes    Education Details  A/ROM exercises    Person(s) Educated  Patient    Methods  Explanation;Demonstration;Handout    Comprehension  Verbalized understanding;Returned demonstration       OT Short Term Goals - 10/31/17 1055      OT SHORT TERM GOAL #1   Title  Patient will be educated on a HEP for improved left shoulder mobility.      Time  6    Period  Weeks      OT SHORT TERM GOAL #2   Title  Patient will increase left shouler P/ROM to [facility] in order to improve ease with donning and doffing shirts and coats.    Time  6    Period  Weeks      OT SHORT TERM GOAL #3   Title  Patient will increase left shoulder strength to 3/5 or better for improved ability to lift bags of groceries.     Time  6    Period  Weeks    Status  On-going      OT SHORT TERM GOAL #4   Title  Patient will decrease pain to 5/10 or better in left shoulder during ADL tasks.     Time  6    Period  Weeks    Status  On-going      OT SHORT TERM GOAL #5   Title  Patient will decrease fascial restrictions from mod-max to moderate for improved mobility required for ADL completion.     Time  6    Period  Weeks        OT Long Term Goals - 10/04/17 1148      OT LONG TERM GOAL #1   Title  Patient will  return to prior level of independence with all B/IADLs and leisure actitivies using left arm actively with all tasks.     Time  12    Period  Weeks    Status  On-going      OT LONG TERM GOAL #2   Title  Patient will improve left shoulder A/ROM to WNL for improved abiilty to reach into  overhead cabinets, behind back to fasten bra.     Time  12    Period  Weeks    Status  On-going      OT LONG TERM GOAL #3   Title  Patient will increase left shoulder strength to 5/5 in order to lift up her grandson.     Time  12    Period  Weeks    Status  On-going      OT LONG TERM GOAL #4   Title  Patient will decrease left shoulder pain to 2/10 or better when reaching overhead or behind her back.     Time  12    Period  Weeks    Status  On-going      OT LONG TERM GOAL #5   Title  Patient will decrease fascial restrictions to minimal or better in her left shoulder for improved mobility required for ADL completion.     Time  12    Period  Weeks    Status  On-going            Plan - 12/05/17 1118    Clinical Impression Statement  A: Progressed to A/ROM per protocol today, continued with manual therapy prior to therapeutic exercises to address fascial restrictions limiting ROM. Pt unable to complete standing A/ROM during horizontal abduction, therefore completed AA/ROM. Added functional reaching activity and UBE, pt able to reaching ROM [facility] during functional reaching. Updated HEP for A/ROM. Verbal cuing for form and technique during exercises.     Plan  P: Follow up on MD appt and HEP, continue with A/ROM working to progress to horizontal abduction in A/ROM. Add x to v arms.        Patient will benefit from skilled therapeutic intervention in order to improve the following deficits and impairments:  Increased muscle spasms, Decreased scar mobility, Decreased strength, Pain, Decreased range of motion, Increased fascial restrictions, Impaired UE functional use  Visit Diagnosis: Stiffness of left shoulder, not elsewhere classified  Acute pain of left shoulder  Other symptoms and signs involving the musculoskeletal system    Problem List Patient Active Problem List   Diagnosis Date Noted  . Reflux esophagitis 10/15/2012  . Hypothyroidism 06/06/2012  . GERD  (gastroesophageal reflux disease) 06/06/2012  . Dysphagia 06/06/2012   Guadelupe Sabin, OTR/L  [phone] 12/05/2017, 11:21 AM  [place], [place], [address] Phone: [phone]   Fax:  [phone]  Name: Lindsay Howe MRN: [id number] Date of Birth: 14-Dec-1960

## 2017-12-05 NOTE — Patient Instructions (Signed)

## 2017-12-07 ENCOUNTER — Encounter (HOSPITAL_COMMUNITY): Payer: 59 | Admitting: Occupational Therapy

## 2017-12-12 ENCOUNTER — Encounter (HOSPITAL_COMMUNITY): Payer: Self-pay | Admitting: Occupational Therapy

## 2017-12-12 ENCOUNTER — Ambulatory Visit (HOSPITAL_COMMUNITY): Payer: 59 | Admitting: Occupational Therapy

## 2017-12-12 DIAGNOSIS — M25612 Stiffness of left shoulder, not elsewhere classified: Secondary | ICD-10-CM

## 2017-12-12 DIAGNOSIS — R29898 Other symptoms and signs involving the musculoskeletal system: Secondary | ICD-10-CM

## 2017-12-12 DIAGNOSIS — M25512 Pain in left shoulder: Secondary | ICD-10-CM

## 2017-12-12 NOTE — Patient Instructions (Addendum)
Theraband strengthening: Complete 10-15X, 1-2X/day  1) Shoulder protraction  Anchor band in doorway, stand with back to door. Push your hand forward as much as you can to bringing your shoulder blades forward on your rib cage.     2) Shoulder flexion  While standing with back to the door, holding Theraband at hand level, raise arm in front of you.  Keep elbow straight through entire movement.      3) Shoulder horizontal abduction  Standing with a theraband anchored at chest height, begin with arm straight and some tension in the band. Move your arm out to your side (keeping straight the whole time). Bring the affected arm back to midline.     4) Shoulder Internal Rotation  While holding an elastic band at your side with your elbow bent, start with your hand away from your stomach, then pull the band towards your stomach. Keep your elbow near your side the entire time.     5) Shoulder External Rotation  While holding an elastic band at your side with your elbow bent, start with your hand near your stomach and then pull the band away. Keep your elbow at your side the entire time.     6) Shoulder abduction  While holding an elastic band at your side, draw up your arm to the side keeping your elbow straight.       Towel Stretch with Internal Rotation   Or     Gently pull up (or to the side) your affected arm  behind your back with the assist of a towel. Hold 10 seconds, repeat 3-5 times. 1-2 times/day.

## 2017-12-12 NOTE — Therapy (Signed)
[place], [place], [address] Phone: [phone]   Fax:  [phone]  Occupational Therapy Reassessment, Treatment, & Discharge Summary  Patient Details  Name: KAHLA RISDON MRN: [id number] Date of Birth: 02-15-1961 Referring Provider: Dr. Esmond Plants   Encounter Date: 12/12/2017  OT End of Session - 12/12/17 1206    Visit Number  14    Number of Visits  24    Date for OT Re-Evaluation  12/28/17    Authorization Type  [employer] 60 visit limit    Authorization - Visit Number  14    Authorization - Number of Visits  60    OT Start Time  3299    OT Stop Time  1106    OT Time Calculation (min)  33 min    Activity Tolerance  Patient tolerated treatment well    Behavior During Therapy  [facility] for tasks assessed/performed       Past Medical History:  Diagnosis Date  . Depression   . GERD (gastroesophageal reflux disease)   . Hyperlipemia   . Insomnia   . Thyroid disease     Past Surgical History:  Procedure Laterality Date  . BLADDER REPAIR    . BREAST ENHANCEMENT SURGERY    . CARPAL TUNNEL RELEASE     left  . COLONOSCOPY WITH ESOPHAGOGASTRODUODENOSCOPY (EGD)  07/05/2012   Procedure: COLONOSCOPY WITH ESOPHAGOGASTRODUODENOSCOPY (EGD);  Surgeon: Rogene Houston, MD;  Location: [facility];  Service: Endoscopy;  Laterality: N/A;  1200  . THYROIDECTOMY, PARTIAL    . TUBAL LIGATION      There were no vitals filed for this visit.  Subjective Assessment - 12/12/17 1033    Subjective   S: The doctor said I didn't need any more therapy.     Currently in Pain?  No/denies         [facility] OT Assessment - 12/12/17 1033      Assessment   Medical Diagnosis  S/P Left RCR       Precautions   Precautions  Shoulder    Type of Shoulder Precautions  4/1-4/29:  pendulum, PROM:  flexion to 90, internal and external rotation, NO ABDUCTION, wrist and elbow A/ROM, isometrics, wand external and internal rotation.  4/30-5/27:   progress PROM to full, include seated P/ROM, may being AA/ROM when patient has full pain free arc, continue er/ir with wand. 6/3-6/24 progress to a/rom, light resistive exercises      Shoulder Interventions  Shoulder sling/immobilizer      Observation/Other Assessments   Focus on Therapeutic Outcomes (FOTO)   68/100      ROM / Strength   AROM / PROM / Strength  AROM;PROM;Strength      Palpation   Palpation comment  Min fascial restrictions noted in left shoudler , scapular region       AROM   Overall AROM Comments  assessed in standing, er/IR adducted    AROM Assessment Site  Shoulder    Right/Left Shoulder  Left    Left Shoulder Flexion  150 Degrees not previously assessed    Left Shoulder ABduction  180 Degrees not previously assessed    Left Shoulder Internal Rotation  90 Degrees not previously assessed    Left Shoulder External Rotation  85 Degrees not previously assessed      PROM   Overall PROM   Within functional limits for tasks performed    Overall PROM Comments  assessed in supine; external and  internal rotation with shoulder adducted    PROM Assessment Site  Shoulder    Right/Left Shoulder  Left    Left Shoulder Flexion  155 Degrees 143 previous    Left Shoulder ABduction  180 Degrees same as previous    Left Shoulder Internal Rotation  90 Degrees same as previous    Left Shoulder External Rotation  90 Degrees 55 previous      Strength   Overall Strength Comments  Assessed seated, er/IR adducted    Strength Assessment Site  Shoulder    Right/Left Shoulder  Left    Left Shoulder Flexion  4-/5 not previously assessed    Left Shoulder ABduction  3+/5 not previously assessed    Left Shoulder Internal Rotation  4+/5 not previously assessed    Left Shoulder External Rotation  4-/5 not previously assessed               OT Treatments/Exercises (OP) - 12/12/17 1036      Exercises   Exercises  Shoulder      Shoulder Exercises: Supine   Protraction  PROM;5 reps     Horizontal ABduction  PROM;5 reps    External Rotation  PROM;5 reps    Internal Rotation  PROM;5 reps    Flexion  PROM;5 reps    ABduction  PROM;5 reps      Shoulder Exercises: Standing   Protraction  Theraband;10 reps    Theraband Level (Shoulder Protraction)  Level 2 (Red)    Horizontal ABduction  Theraband;10 reps    Theraband Level (Shoulder Horizontal ABduction)  Level 2 (Red)    External Rotation  Theraband;10 reps    Theraband Level (Shoulder External Rotation)  Level 2 (Red)    Internal Rotation  Theraband;10 reps    Theraband Level (Shoulder Internal Rotation)  Level 2 (Red)    Flexion  Theraband;10 reps    Theraband Level (Shoulder Flexion)  Level 2 (Red)    ABduction  Theraband;10 reps    Theraband Level (Shoulder ABduction)  Level 2 (Red)      Shoulder Exercises: ROM/Strengthening   Other ROM/Strengthening Exercises  Pt passed tennis ball behind back working on IR, 10X      Shoulder Exercises: Stretch   Internal Rotation Stretch  2 reps one horizontal, one vertical, 10" each      Manual Therapy   Manual Therapy  Soft tissue mobilization    Manual therapy comments  manual therapy interventions completed seperately from all other interventions this date    Soft tissue mobilization  soft tissue mobilization to decrease tightness and restrictions and imrpove main free mobility in left shoulder region              OT Education - 12/12/17 1205    Education provided  Yes    Education Details  red theraband strengthening, IR stretch       OT Short Term Goals - 12/12/17 1206      OT SHORT TERM GOAL #1   Title  Patient will be educated on a HEP for improved left shoulder mobility.      Time  6    Period  Weeks      OT SHORT TERM GOAL #2   Title  Patient will increase left shouler P/ROM to [facility] in order to improve ease with donning and doffing shirts and coats.    Time  6    Period  Weeks      OT SHORT TERM GOAL #3  Title  Patient will increase left  shoulder strength to 3/5 or better for improved ability to lift bags of groceries.     Time  6    Period  Weeks    Status  Achieved      OT SHORT TERM GOAL #4   Title  Patient will decrease pain to 5/10 or better in left shoulder during ADL tasks.     Time  6    Period  Weeks    Status  Achieved      OT SHORT TERM GOAL #5   Title  Patient will decrease fascial restrictions from mod-max to moderate for improved mobility required for ADL completion.     Time  6    Period  Weeks        OT Long Term Goals - 12/12/17 1207      OT LONG TERM GOAL #1   Title  Patient will return to prior level of independence with all B/IADLs and leisure actitivies using left arm actively with all tasks.     Time  12    Period  Weeks    Status  Partially Met      OT LONG TERM GOAL #2   Title  Patient will improve left shoulder A/ROM to WNL for improved abiilty to reach into overhead cabinets, behind back to fasten bra.     Time  12    Period  Weeks    Status  Achieved      OT LONG TERM GOAL #3   Title  Patient will increase left shoulder strength to 5/5 in order to lift up her grandson.     Time  12    Period  Weeks    Status  Not Met      OT LONG TERM GOAL #4   Title  Patient will decrease left shoulder pain to 2/10 or better when reaching overhead or behind her back.     Time  12    Period  Weeks    Status  Achieved      OT LONG TERM GOAL #5   Title  Patient will decrease fascial restrictions to minimal or better in her left shoulder for improved mobility required for ADL completion.     Time  12    Period  Weeks    Status  Achieved            Plan - 12/12/17 1207    Clinical Impression Statement  A: Reassessment completed today per pt request for discharge, pt reporting MD has released her and instructed her to continue therapy with HEP. Pt has met all STGs, 3/5 LTGs, and partially met an additional LTG. Pt reports only issue currently is reaching behind her back to fasten her  bra. Pt educated on IR stretches today as she continues to have deficits with reaching behind back, also updated HEP for red theraband strengthening with instructions to begin lightly and increase as tolerated. Pt has ROM WFL, continues to have strength deficits with lifting above shoulder height and overhead. Pt verbalized understanding with HEP, instructed on form.     Plan  P: Discharge pt       Patient will benefit from skilled therapeutic intervention in order to improve the following deficits and impairments:  Increased muscle spasms, Decreased scar mobility, Decreased strength, Pain, Decreased range of motion, Increased fascial restrictions, Impaired UE functional use  Visit Diagnosis: Stiffness of left shoulder, not elsewhere classified  Acute pain of  left shoulder  Other symptoms and signs involving the musculoskeletal system    Problem List Patient Active Problem List   Diagnosis Date Noted  . Reflux esophagitis 10/15/2012  . Hypothyroidism 06/06/2012  . GERD (gastroesophageal reflux disease) 06/06/2012  . Dysphagia 06/06/2012   Guadelupe Sabin, OTR/L  [phone] 12/12/2017, 12:10 PM  [place], [place], [address] Phone: [phone]   Fax:  [phone]  Name: MOLINA HOLLENBACK MRN: [id number] Date of Birth: [birth year]   OCCUPATIONAL THERAPY DISCHARGE SUMMARY  Visits from Start of Care: 14  Current functional level related to goals / functional outcomes: See above. Pt is completing all ADLs independently, and is happy with her progress.    Remaining deficits: Reaching behind back, strength with any lifting above shoulder height and overhead, pt continues to have deficits with activity tolerance.    Education / Equipment: HEP for A/ROM, IR stretching, and shoulder strengthening with red band  Plan: Patient agrees to discharge.  Patient goals were met. Patient is being discharged due to being  pleased with the current functional level.  ?????

## 2017-12-14 ENCOUNTER — Encounter (HOSPITAL_COMMUNITY): Payer: 59 | Admitting: Occupational Therapy

## 2017-12-18 ENCOUNTER — Telehealth (HOSPITAL_COMMUNITY): Payer: Self-pay | Admitting: Internal Medicine

## 2017-12-18 NOTE — Telephone Encounter (Signed)
12/18/17  pt called and said she got a reminder call but all of her appts were supposed to have been cancelled last week

## 2017-12-19 ENCOUNTER — Ambulatory Visit (HOSPITAL_COMMUNITY): Payer: 59 | Admitting: Occupational Therapy

## 2017-12-21 ENCOUNTER — Encounter (HOSPITAL_COMMUNITY): Payer: 59 | Admitting: Occupational Therapy

## 2019-04-02 ENCOUNTER — Ambulatory Visit (INDEPENDENT_AMBULATORY_CARE_PROVIDER_SITE_OTHER): Payer: 59 | Admitting: Internal Medicine

## 2019-04-16 ENCOUNTER — Ambulatory Visit (INDEPENDENT_AMBULATORY_CARE_PROVIDER_SITE_OTHER): Payer: 59 | Admitting: Internal Medicine

## 2019-05-07 ENCOUNTER — Other Ambulatory Visit: Payer: Self-pay

## 2019-05-07 DIAGNOSIS — Z20822 Contact with and (suspected) exposure to covid-19: Secondary | ICD-10-CM

## 2019-05-08 ENCOUNTER — Telehealth: Payer: Self-pay

## 2019-05-08 NOTE — Telephone Encounter (Signed)
Patient called requesting her COVID-19 test result.  She was informed that her test was still active and had not resulted. She verbalized understanding of all information. She was encouraged to call back.

## 2019-05-09 ENCOUNTER — Telehealth: Payer: Self-pay | Admitting: *Deleted

## 2019-05-09 NOTE — Telephone Encounter (Signed)
Pt calling for covid results; active. Pt aware not resulted yet. 

## 2019-05-10 LAB — NOVEL CORONAVIRUS, NAA: SARS-CoV-2, NAA: NOT DETECTED

## 2019-05-20 ENCOUNTER — Ambulatory Visit (INDEPENDENT_AMBULATORY_CARE_PROVIDER_SITE_OTHER): Payer: Self-pay | Admitting: Internal Medicine

## 2019-05-29 ENCOUNTER — Other Ambulatory Visit: Payer: Self-pay

## 2019-05-29 ENCOUNTER — Encounter (INDEPENDENT_AMBULATORY_CARE_PROVIDER_SITE_OTHER): Payer: Self-pay | Admitting: Internal Medicine

## 2019-05-29 ENCOUNTER — Ambulatory Visit (INDEPENDENT_AMBULATORY_CARE_PROVIDER_SITE_OTHER): Payer: Self-pay | Admitting: Internal Medicine

## 2019-05-29 VITALS — BP 142/73 | HR 88 | Temp 98.3°F | Resp 18 | Ht 61.0 in | Wt 188.0 lb

## 2019-05-29 DIAGNOSIS — R5381 Other malaise: Secondary | ICD-10-CM

## 2019-05-29 DIAGNOSIS — Z23 Encounter for immunization: Secondary | ICD-10-CM

## 2019-05-29 DIAGNOSIS — E559 Vitamin D deficiency, unspecified: Secondary | ICD-10-CM

## 2019-05-29 DIAGNOSIS — Z1159 Encounter for screening for other viral diseases: Secondary | ICD-10-CM

## 2019-05-29 DIAGNOSIS — E2839 Other primary ovarian failure: Secondary | ICD-10-CM

## 2019-05-29 DIAGNOSIS — E669 Obesity, unspecified: Secondary | ICD-10-CM

## 2019-05-29 DIAGNOSIS — N951 Menopausal and female climacteric states: Secondary | ICD-10-CM

## 2019-05-29 DIAGNOSIS — E785 Hyperlipidemia, unspecified: Secondary | ICD-10-CM

## 2019-05-29 DIAGNOSIS — E782 Mixed hyperlipidemia: Secondary | ICD-10-CM

## 2019-05-29 DIAGNOSIS — E039 Hypothyroidism, unspecified: Secondary | ICD-10-CM

## 2019-05-29 DIAGNOSIS — R5383 Other fatigue: Secondary | ICD-10-CM

## 2019-05-29 HISTORY — DX: Menopausal and female climacteric states: N95.1

## 2019-05-29 HISTORY — DX: Obesity, unspecified: E66.9

## 2019-05-29 HISTORY — DX: Hyperlipidemia, unspecified: E78.5

## 2019-05-29 HISTORY — DX: Vitamin D deficiency, unspecified: E55.9

## 2019-05-29 HISTORY — DX: Other primary ovarian failure: E28.39

## 2019-05-29 NOTE — Progress Notes (Signed)
Metrics: Intervention Frequency ACO  Documented Smoking Status Yearly  Screened one or more times in 24 months  Cessation Counseling or  Active cessation medication Past 24 months  Past 24 months   Guideline developer: UpToDate (See UpToDate for funding source) Date Released: 2014       Wellness Office Visit  Subjective:  Patient ID: Lindsay Howe, female    DOB: 1961-02-05  Age: 58 y.o. MRN: [id number]  CC: This lady comes in for follow-up regarding her menopausal symptoms of hot flashes, decreased libido, malaise as well as follow-up regarding her vitamin D deficiency and hypothyroidism. HPI  When I had seen her on the last occasion in August, I had changed many of her medications which are now listed below.  She feels much improved in terms of energy with this change.  Her libido has also improved.  Her sleep appears to have improved also. She also started to follow nutritional guidelines and with intermittent fasting was able to lose significant weight.  In the last few weeks she has not been as good but overall has still managed to lose weight. She says she still continues to get some hot flashes although they have improved. Past Medical History:  Diagnosis Date  . Depression   . GERD (gastroesophageal reflux disease)   . HLD (hyperlipidemia) 05/29/2019  . Hot flashes due to menopause 05/29/2019  . Hyperlipemia   . Insomnia   . Obesity (BMI 30-39.9) 05/29/2019  . 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 Thyroid disease   . Vitamin D deficiency disease 05/29/2019      Family History  Problem Relation Age of Onset  . Hypothyroidism Mother   . Hypertension Mother   . Hyperlipidemia Mother   . Osteoporosis Mother   . Colon cancer Maternal Grandmother   . Stroke Maternal Grandfather     Social History   Social History Narrative   Married for 24 years. Has 2 kids (boy & girl). Unemployed.   Social History   Tobacco Use  . Smoking status: Former Smoker    Packs/day:  1.00    Years: 15.00    Pack years: 15.00    Types: Cigarettes    Quit date: 03/28/2011    Years since quitting: 8.1  . Smokeless tobacco: Never Used  Substance Use Topics  . Alcohol use: Yes    Alcohol/week: 3.0 standard drinks    Types: 3 Glasses of wine per week    Current Meds  Medication Sig  . ALPRAZolam (XANAX) 0.5 MG tablet Take 0.5 mg by mouth at bedtime as needed for sleep.   . Cholecalciferol (VITAMIN D-3) 125 MCG (5000 UT) TABS Take 2 tablets by mouth daily.  Marland Kitchen escitalopram (LEXAPRO) 20 MG tablet Take 20 mg by mouth daily.  Marland Kitchen estradiol (ESTRACE) 1 MG tablet Take 1 mg by mouth daily.  . NP THYROID 90 MG tablet Take 90 mg by mouth daily.  Marland Kitchen omeprazole (PRILOSEC) 20 MG capsule Take 20 mg by mouth daily.  . pramipexole (MIRAPEX) 0.25 MG tablet Take 0.25 mg by mouth at bedtime. Usually twice a week  . progesterone (PROMETRIUM) 200 MG capsule Take 200 mg by mouth daily.   . TESTOSTERONE CYPIONATE IM Inject 5 mg into the muscle once a week.     Nutrition  Intermittent fasting with low carbohydrate, moderate protein and higher fat diet. Sleep  Improved sleep with uninterrupted sleep now.  Exercise  None regular. Bio Identical Hormones  Testosterone therapy is being used off label  for symptoms of testosterone deficiency and benefits that it produces based on several studies.  These benefits include decreasing body fat, increasing in lean muscle mass and increasing in bone density.  There is improvement of memory, cognition.  There is improvement in exercise tolerance and endurance.  Testosterone therapy has also been shown to be protective against coronary artery disease, cerebrovascular disease, diabetes, hypertension and degenerative joint disease. I have discussed with the patient the FDA warnings regarding testosterone therapy, benefits and side effects and modes of administration as well as monitoring blood levels and side effects  on a regular basis The patient is  agreeable that testosterone therapy should be an integral part of his/her wellness,quality of life and prevention of chronic disease.  Micronized progesterone is being used in this patient for multiple benefits based on studies including protection against uterine cancer, breast cancer, osteoporosis and heart disease. The patient has been counseled regarding side effects, benefits and modes of administration. The patient is agreeable that this therapy is an integral part of her wellness, quality of life and prevention of chronic disease.  Estradiol is being used in this patient for multiple benefits based on several studies including protection against heart disease, cerebrovascular disease, osteoporosis, colon cancer, Alzheimer's disease, macular degeneration and cataracts. The patient has been counseled regarding benefits and side effects and modes of administration. The patient is agreeable that this therapy is an integral to part of her wellness, quality of life and prevention of chronic disease.  Objective:   Today's Vitals: BP (!) 142/73 (BP Location: Right Arm, Patient Position: Sitting, Cuff Size: Normal)   Pulse 88   Temp 98.3 F (36.8 C) (Temporal)   Resp 18   Ht '5\' 1"'  (1.549 m)   Wt 188 lb (85.3 kg)   LMP 02/01/2012   SpO2 98% Comment: with mask.  BMI 35.52 kg/m  Vitals with BMI 05/29/2019 01/05/2016 06/30/2015  Height '5\' 1"'  5' 1.5" 5' 1.5"  Weight 188 lbs 143 lbs 159 lbs  BMI 35.54 01.7 79.3  Systolic 903 009 233  Diastolic 73 72 74  Pulse 88 72 78     Physical Exam  She looks systemically well and has dropped about 7 pounds since the last time I saw her.  No other new physical findings today.     Assessment   1. Acquired hypothyroidism   2. Vitamin D deficiency disease   3. Mixed hyperlipidemia   4. Obesity (BMI 30-39.9)   5. Hot flashes due to menopause   6. Primary ovarian failure   7. Malaise and fatigue   8. Encounter for hepatitis C screening test for low  risk patient       Tests ordered Orders Placed This Encounter  Procedures  . CMP with eGFR([facility])  . Lipid Panel  . Vitamin D, 25-hydroxy  . Testos,Total,Free and SHBG (Female)  . T3, Free  . TSH  . Progesterone  . Estradiol  . Hep C Antibody     Plan: 1. Blood work is ordered above. 2. She will continue with bioidentical hormone therapy. 3. She will continue with desiccated NP thyroid. 4. She will continue with higher dose of vitamin D3 10,000 units daily. 5. Further recommendations will depend on blood results and she will come back in a couple of months time as I suspect we may need to optimize hormones further.  I told her and emphasized that she must get back on nutritional plan that we have discussed to continue weight loss. 6. She  was given influenza vaccination today.   No orders of the defined types were placed in this encounter.   Doree Albee, MD

## 2019-06-02 LAB — TESTOS,TOTAL,FREE AND SHBG (FEMALE)
Free Testosterone: 4.6 pg/mL (ref 0.1–6.4)
Sex Hormone Binding: 48 nmol/L (ref 14–73)
Testosterone, Total, LC-MS-MS: 35 ng/dL (ref 2–45)

## 2019-06-02 LAB — TSH: TSH: 8.25 mIU/L — ABNORMAL HIGH (ref 0.40–4.50)

## 2019-06-02 LAB — COMPLETE METABOLIC PANEL WITH GFR
AG Ratio: 1.4 (calc) (ref 1.0–2.5)
ALT: 18 U/L (ref 6–29)
AST: 13 U/L (ref 10–35)
Albumin: 4 g/dL (ref 3.6–5.1)
Alkaline phosphatase (APISO): 70 U/L (ref 37–153)
BUN/Creatinine Ratio: 25 (calc) — ABNORMAL HIGH (ref 6–22)
BUN: 26 mg/dL — ABNORMAL HIGH (ref 7–25)
CO2: 23 mmol/L (ref 20–32)
Calcium: 9.5 mg/dL (ref 8.6–10.4)
Chloride: 105 mmol/L (ref 98–110)
Creat: 1.02 mg/dL (ref 0.50–1.05)
GFR, Est African American: 70 mL/min/{1.73_m2} (ref >=60)
GFR, Est Non African American: 61 mL/min/{1.73_m2} (ref >=60)
Globulin: 2.8 g/dL (calc) (ref 1.9–3.7)
Glucose, Bld: 95 mg/dL (ref 65–99)
Potassium: 4.3 mmol/L (ref 3.5–5.3)
Sodium: 137 mmol/L (ref 135–146)
Total Bilirubin: 0.4 mg/dL (ref 0.2–1.2)
Total Protein: 6.8 g/dL (ref 6.1–8.1)

## 2019-06-02 LAB — LIPID PANEL
Cholesterol: 279 mg/dL — ABNORMAL HIGH (ref <200)
HDL: 60 mg/dL (ref >=50)
Non-HDL Cholesterol (Calc): 219 mg/dL (calc) — ABNORMAL HIGH (ref <130)
Total CHOL/HDL Ratio: 4.7 (calc) (ref <5.0)
Triglycerides: 409 mg/dL — ABNORMAL HIGH (ref <150)

## 2019-06-02 LAB — PROGESTERONE: Progesterone: 15.3 ng/mL

## 2019-06-02 LAB — VITAMIN D 25 HYDROXY (VIT D DEFICIENCY, FRACTURES): Vit D, 25-Hydroxy: 48 ng/mL (ref 30–100)

## 2019-06-02 LAB — T3, FREE: T3, Free: 3.4 pg/mL (ref 2.3–4.2)

## 2019-06-02 LAB — HEPATITIS C ANTIBODY
Hepatitis C Ab: NONREACTIVE
SIGNAL TO CUT-OFF: 0.01

## 2019-06-02 LAB — ESTRADIOL: Estradiol: 26 pg/mL

## 2019-06-03 ENCOUNTER — Other Ambulatory Visit (INDEPENDENT_AMBULATORY_CARE_PROVIDER_SITE_OTHER): Payer: Self-pay | Admitting: Internal Medicine

## 2019-06-03 MED ORDER — THYROID 120 MG PO TABS: 120.0000 mg | ORAL_TABLET | Freq: Every day | ORAL | 3 refills | Status: DC

## 2019-06-06 ENCOUNTER — Other Ambulatory Visit (INDEPENDENT_AMBULATORY_CARE_PROVIDER_SITE_OTHER): Payer: Self-pay | Admitting: Internal Medicine

## 2019-07-22 ENCOUNTER — Ambulatory Visit (INDEPENDENT_AMBULATORY_CARE_PROVIDER_SITE_OTHER): Payer: Self-pay | Admitting: Internal Medicine

## 2019-07-22 ENCOUNTER — Encounter (INDEPENDENT_AMBULATORY_CARE_PROVIDER_SITE_OTHER): Payer: Self-pay | Admitting: Internal Medicine

## 2019-07-22 ENCOUNTER — Other Ambulatory Visit: Payer: Self-pay

## 2019-07-22 VITALS — BP 144/80 | HR 99 | Temp 97.7°F | Resp 18 | Ht 61.0 in | Wt 195.0 lb

## 2019-07-22 DIAGNOSIS — E669 Obesity, unspecified: Secondary | ICD-10-CM

## 2019-07-22 DIAGNOSIS — E2839 Other primary ovarian failure: Secondary | ICD-10-CM

## 2019-07-22 DIAGNOSIS — E559 Vitamin D deficiency, unspecified: Secondary | ICD-10-CM

## 2019-07-22 DIAGNOSIS — N951 Menopausal and female climacteric states: Secondary | ICD-10-CM

## 2019-07-22 DIAGNOSIS — E039 Hypothyroidism, unspecified: Secondary | ICD-10-CM

## 2019-07-22 LAB — TSH: TSH: 1.54 mIU/L (ref 0.40–4.50)

## 2019-07-22 LAB — T3, FREE: T3, Free: 6.2 pg/mL — ABNORMAL HIGH (ref 2.3–4.2)

## 2019-07-22 MED ORDER — ESTRADIOL 1 MG PO TABS: 1.5000 mg | ORAL_TABLET | Freq: Every day | ORAL | 3 refills | Status: DC

## 2019-07-22 NOTE — Progress Notes (Signed)
Metrics: Intervention Frequency ACO  Documented Smoking Status Yearly  Screened one or more times in 24 months  Cessation Counseling or  Active cessation medication Past 24 months  Past 24 months   Guideline developer: UpToDate (See UpToDate for funding source) Date Released: 2014       Wellness Office Visit  Subjective:  Patient ID: Lindsay Howe, female    DOB: 1960/12/15  Age: 59 y.o. MRN: [id number]  CC: This lady comes in for follow-up regarding her obesity, menopausal symptoms, thyroid treatment and vitamin D deficiency.  HPI She did increase her desiccated NP thyroid to 120 mg daily but feels that she is more jittery and tremulous.  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 I had sent her a message to increase the estradiol to 1.5 mg daily but I do not think she read the message.  She continues to take estradiol at 1 mg daily. She has noticed that testosterone therapy has been working well except that in the second half of the week, she feels that her energy levels and sex drive taper off.  Her testosterone levels when they were checked on the last visit were not in an optimal range.  Past Medical History:  Diagnosis Date  . Depression   . GERD (gastroesophageal reflux disease)   . HLD (hyperlipidemia) 05/29/2019  . Hot flashes due to menopause 05/29/2019  . Hyperlipemia   . Insomnia   . Obesity (BMI 30-39.9) 05/29/2019  . 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 Thyroid disease   . Vitamin D deficiency disease 05/29/2019      Family History  Problem Relation Age of Onset  . Hypothyroidism Mother   . Hypertension Mother   . Hyperlipidemia Mother   . Osteoporosis Mother   . Colon cancer Maternal Grandmother   . Stroke Maternal Grandfather     Social History   Social History Narrative   Married for 24 years. Has 2 kids (boy & girl). Unemployed.   Social History   Tobacco Use  . Smoking  status: Former Smoker    Packs/day: 1.00    Years: 15.00    Pack years: 15.00    Types: Cigarettes    Quit date: 03/28/2011    Years since quitting: 8.3  . Smokeless tobacco: Never Used  Substance Use Topics  . Alcohol use: Yes    Alcohol/week: 3.0 standard drinks    Types: 3 Glasses of wine per week    Current Meds  Medication Sig  . ALPRAZolam (XANAX) 0.5 MG tablet Take 0.5 mg by mouth at bedtime as needed for sleep.   . Cholecalciferol (VITAMIN D-3) 125 MCG (5000 UT) TABS Take 2 tablets by mouth daily.  Marland Kitchen escitalopram (LEXAPRO) 20 MG tablet Take 20 mg by mouth daily.  Marland Kitchen estradiol (ESTRACE) 1 MG tablet Take 1.5 tablets (1.5 mg total) by mouth daily.  Marland Kitchen omeprazole (PRILOSEC) 20 MG capsule Take 20 mg by mouth daily.  . pramipexole (MIRAPEX) 0.25 MG tablet Take 0.25 mg by mouth at bedtime. Usually twice a week  . progesterone (PROMETRIUM) 200 MG capsule TAKE ONE CAPSULE BY MOUTH EVERY NIGHT  . TESTOSTERONE CYPIONATE IM Inject 5 mg into the muscle once a week.  . thyroid (NP THYROID) 120 MG tablet Take 1 tablet (120 mg total) by mouth daily before breakfast.  . [DISCONTINUED] estradiol (ESTRACE) 1 MG tablet TAKE ONE (1) TABLET BY MOUTH EVERY DAY  Nutrition  Unfortunately, she has been on vacation for the last couple of weeks and nutrition has not been very optimal.  She does try intermittent fasting but in the meantime she has been drinking more beer and eating more snacks, primarily junk food. Sleep  Adequate.  Exercise  None formally. Bio Identical Hormones  She continues with estradiol, progesterone and testosterone as before.   Objective:   Today's Vitals: BP (!) 144/80 (BP Location: Left Arm, Patient Position: Sitting, Cuff Size: Normal)   Pulse 99   Temp 97.7 F (36.5 C) (Temporal)   Resp 18   Ht 5\' 1"  (1.549 m)   Wt 195 lb (88.5 kg)   LMP 02/01/2012   SpO2 98% Comment: wearing mask  BMI 36.84 kg/m  Vitals with BMI 07/22/2019 05/29/2019 01/05/2016  Height 5'  1" 5\' 1"  5' 1.5"  Weight 195 lbs 188 lbs 143 lbs  BMI 36.86 21.19 41.7  Systolic 408 144 818  Diastolic 80 73 72  Pulse 99 88 72     Physical Exam   I see that she has gained about 7 pounds in weight since her last visit.  Alert and orientated.    Assessment   1. Acquired hypothyroidism   2. Primary ovarian failure   3. Vitamin D deficiency disease   4. Obesity (BMI 30-39.9)   5. Hot flashes due to menopause       Tests ordered Orders Placed This Encounter  Procedures  . T3, free  . TSH     Plan: 1. I will check thyroid levels today and we may need to adjust downwards her thyroid dose.  She did take the NP thyroid 120 mg tablet this morning. 2. I wanted to increase the estradiol to 1.5 mg daily and I have sent a new dose and clarified this with her. 3. Also, we discussed testosterone therapy and she is willing to increase the dose further so she will take 5 mg intramuscularly twice a week now. 4. She will continue to focus on nutrition as before. 5. I will see her in about 2 months time for follow-up to see how she is doing and we will check levels again.   Meds ordered this encounter  Medications  . estradiol (ESTRACE) 1 MG tablet    Sig: Take 1.5 tablets (1.5 mg total) by mouth daily.    Dispense:  45 tablet    Refill:  3    Braylin Formby Luther Parody, MD

## 2019-08-28 ENCOUNTER — Other Ambulatory Visit: Payer: Self-pay

## 2019-08-28 ENCOUNTER — Ambulatory Visit: Payer: Self-pay | Attending: Internal Medicine

## 2019-08-28 DIAGNOSIS — Z20822 Contact with and (suspected) exposure to covid-19: Secondary | ICD-10-CM | POA: Insufficient documentation

## 2019-08-29 LAB — NOVEL CORONAVIRUS, NAA: SARS-CoV-2, NAA: NOT DETECTED

## 2019-09-17 ENCOUNTER — Ambulatory Visit (INDEPENDENT_AMBULATORY_CARE_PROVIDER_SITE_OTHER): Payer: Self-pay | Admitting: Internal Medicine

## 2019-09-17 ENCOUNTER — Other Ambulatory Visit: Payer: Self-pay

## 2019-09-17 ENCOUNTER — Encounter (INDEPENDENT_AMBULATORY_CARE_PROVIDER_SITE_OTHER): Payer: Self-pay | Admitting: Internal Medicine

## 2019-09-17 VITALS — BP 115/68 | HR 83 | Temp 98.3°F | Ht 61.0 in | Wt 195.6 lb

## 2019-09-17 DIAGNOSIS — E039 Hypothyroidism, unspecified: Secondary | ICD-10-CM

## 2019-09-17 DIAGNOSIS — E669 Obesity, unspecified: Secondary | ICD-10-CM

## 2019-09-17 DIAGNOSIS — E2839 Other primary ovarian failure: Secondary | ICD-10-CM

## 2019-09-17 NOTE — Progress Notes (Signed)
Metrics: Intervention Frequency ACO  Documented Smoking Status Yearly  Screened one or more times in 24 months  Cessation Counseling or  Active cessation medication Past 24 months  Past 24 months   Guideline developer: UpToDate (See UpToDate for funding source) Date Released: 2014       Wellness Office Visit  Subjective:  Patient ID: Lindsay Howe, female    DOB: Sep 08, 1960  Age: 59 y.o. MRN: [id number]  CC: This lady comes in for follow-up of obesity, bioidentical hormone therapy due to her menopause, testosterone therapy. HPI  On the last visit, she was getting very jittery and we agreed to reduce the NP thyroid.  She decided to take NP thyroid 120 mg tablet 1 day but on the following day she would cut the tablet in half.  She seems to be okay with this.  I told her that since the tablet is not scored, we are not guaranteed that she is getting 60 mg dose on the days that she cuts the tablet in half. She has tolerated higher dose of estradiol at 1.5 mg daily. We also increase testosterone to twice a week injections and she feels better with this also with increased energy and sex drive. She continues on the same dose of progesterone. Past Medical History:  Diagnosis Date  . Depression   . GERD (gastroesophageal reflux disease)   . HLD (hyperlipidemia) 05/29/2019  . Hot flashes due to menopause 05/29/2019  . Hyperlipemia   . Insomnia   . Obesity (BMI 30-39.9) 05/29/2019  . 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 Thyroid disease   . Vitamin D deficiency disease 05/29/2019      Family History  Problem Relation Age of Onset  . Hypothyroidism Mother   . Hypertension Mother   . Hyperlipidemia Mother   . Osteoporosis Mother   . Colon cancer Maternal Grandmother   . Stroke Maternal Grandfather     Social History   Social History Narrative   Married for 24 years. Has 2 kids (boy & girl). Unemployed.   Social History   Tobacco Use  . Smoking status: Former Smoker   Packs/day: 1.00    Years: 15.00    Pack years: 15.00    Types: Cigarettes    Quit date: 03/28/2011    Years since quitting: 8.4  . Smokeless tobacco: Never Used  Substance Use Topics  . Alcohol use: Yes    Alcohol/week: 3.0 standard drinks    Types: 3 Glasses of wine per week    Current Meds  Medication Sig  . ALPRAZolam (XANAX) 0.5 MG tablet Take 0.5 mg by mouth at bedtime as needed for sleep.   . Cholecalciferol (VITAMIN D-3) 125 MCG (5000 UT) TABS Take 2 tablets by mouth daily.  Marland Kitchen escitalopram (LEXAPRO) 20 MG tablet Take 20 mg by mouth daily.  Marland Kitchen estradiol (ESTRACE) 1 MG tablet Take 1.5 tablets (1.5 mg total) by mouth daily.  Marland Kitchen omeprazole (PRILOSEC) 20 MG capsule Take 20 mg by mouth daily.  . pramipexole (MIRAPEX) 0.25 MG tablet Take 0.25 mg by mouth at bedtime. Usually twice a week  . progesterone (PROMETRIUM) 200 MG capsule TAKE ONE CAPSULE BY MOUTH EVERY NIGHT  . TESTOSTERONE CYPIONATE IM Inject 5 mg into the muscle 2 (two) times a week.   . thyroid (NP THYROID) 120 MG tablet Take 1 tablet (120 mg total) by mouth daily before breakfast.     Nutrition  She has been doing some intermittent fasting but not consistently I suspect.  She has a powder that is plant-based and takes a shake with this. Sleep  Adequate.  Exercise  None regular.  She is planning to go to [employer] with her daughter today. Bio Identical Hormones  Testosterone therapy is being used off label for symptoms of testosterone deficiency and benefits that it produces based on several studies.  These benefits include decreasing body fat, increasing in lean muscle mass and increasing in bone density.  There is improvement of memory, cognition.  There is improvement in exercise tolerance and endurance.  Testosterone therapy has also been shown to be protective against coronary artery disease, cerebrovascular disease, diabetes, hypertension and degenerative joint disease. I have discussed with the patient the  FDA warnings regarding testosterone therapy, benefits and side effects and modes of administration as well as monitoring blood levels and side effects  on a regular basis The patient is agreeable that testosterone therapy should be an integral part of his/her wellness,quality of life and prevention of chronic disease.  This patient is being treated with desiccated thyroid, off label, for symptoms of thyroid deficiency.  The patient has been counseled regarding side effects and how to deal with them.  Micronized progesterone is being used in this patient for multiple benefits based on studies including protection against uterine cancer, breast cancer, osteoporosis and heart disease. The patient has been counseled regarding side effects, benefits and modes of administration. The patient is agreeable that this therapy is an integral part of her wellness, quality of life and prevention of chronic disease.  Estradiol is being used in this patient for multiple benefits based on several studies including protection against heart disease, cerebrovascular disease, osteoporosis, colon cancer, Alzheimer's disease, macular degeneration and cataracts. The patient has been counseled regarding benefits and side effects and modes of administration. The patient is agreeable that this therapy is an integral to part of her wellness, quality of life and prevention of chronic disease.  Objective:   Today's Vitals: BP 115/68 (BP Location: Left Arm, Patient Position: Sitting, Cuff Size: Normal)   Pulse 83   Temp 98.3 F (36.8 C) (Temporal)   Ht 5\' 1"  (1.549 m)   Wt 195 lb 9.6 oz (88.7 kg)   LMP 02/01/2012   SpO2 95%   BMI 36.96 kg/m  Vitals with BMI 09/17/2019 07/22/2019 05/29/2019  Height 5\' 1"  5\' 1"  5\' 1"   Weight 195 lbs 10 oz 195 lbs 188 lbs  BMI 36.98 36.86 35.54  Systolic 115 144 14/07/2018  Diastolic 68 80 73  Pulse 83 99 88     Physical Exam  She remains obese.  She has not lost any weight.  She looks well  however her blood pressure is better.     Assessment   1. Acquired hypothyroidism   2. Primary ovarian failure   3. Obesity (BMI 30-39.9)       Tests ordered Orders Placed This Encounter  Procedures  . T3, free     Plan: 1. I am just going to check the T3 today due to cost issues as she does not have any health insurance. 2. She will continue with all medications above. 3. Further recommendations will depend on blood results and I will see her in 3 months time for follow-up.   No orders of the defined types were placed in this encounter.   , MD

## 2019-09-18 ENCOUNTER — Encounter (INDEPENDENT_AMBULATORY_CARE_PROVIDER_SITE_OTHER): Payer: Self-pay | Admitting: Internal Medicine

## 2019-09-18 LAB — T3, FREE: T3, Free: 2.8 pg/mL (ref 2.3–4.2)

## 2019-09-19 ENCOUNTER — Other Ambulatory Visit (INDEPENDENT_AMBULATORY_CARE_PROVIDER_SITE_OTHER): Payer: Self-pay | Admitting: Internal Medicine

## 2019-09-19 ENCOUNTER — Ambulatory Visit: Payer: Self-pay | Attending: Internal Medicine

## 2019-09-19 DIAGNOSIS — Z23 Encounter for immunization: Secondary | ICD-10-CM

## 2019-09-19 MED ORDER — THYROID 90 MG PO TABS: 90.0000 mg | ORAL_TABLET | Freq: Every day | ORAL | 3 refills | Status: DC

## 2019-09-19 NOTE — Progress Notes (Signed)
   Covid-19 Vaccination Clinic  Name:  Lindsay Howe    MRN: [id number] DOB: 05-29-1961  09/19/2019  [name] was observed post Covid-19 immunization for 15 minutes without incident. She was provided with Vaccine Information Sheet and instruction to access the V-Safe system.   [name] was instructed to call 911 with any severe reactions post vaccine: Marland Kitchen Difficulty breathing  . Swelling of face and throat  . A fast heartbeat  . A bad rash all over body  . Dizziness and weakness   Immunizations Administered    Name Date Dose VIS Date Route   Moderna COVID-19 Vaccine 09/19/2019 11:41 AM 0.5 mL 05/28/2019 Intramuscular   Manufacturer: Moderna   Lot: [id number]   NDC: 21117-356-70

## 2019-09-19 NOTE — Telephone Encounter (Signed)
Change dose to np thyroid & send to [facility] pharmacy.

## 2019-10-07 ENCOUNTER — Other Ambulatory Visit (INDEPENDENT_AMBULATORY_CARE_PROVIDER_SITE_OTHER): Payer: Self-pay | Admitting: Internal Medicine

## 2019-10-22 ENCOUNTER — Ambulatory Visit: Payer: Self-pay

## 2019-10-23 ENCOUNTER — Ambulatory Visit: Payer: Self-pay | Attending: Internal Medicine

## 2019-10-23 ENCOUNTER — Other Ambulatory Visit: Payer: Self-pay

## 2019-10-23 DIAGNOSIS — Z23 Encounter for immunization: Secondary | ICD-10-CM

## 2019-10-23 NOTE — Progress Notes (Signed)
   Covid-19 Vaccination Clinic  Name:  [name]    MRN: [id number] DOB: [birth year]  10/23/2019  [name] was observed post Covid-19 immunization for 15 minutes without incident. She was provided with Vaccine Information Sheet and instruction to access the V-Safe system.   [name] was instructed to call 911 with any severe reactions post vaccine: Marland Kitchen Difficulty breathing  . Swelling of face and throat  . A fast heartbeat  . A bad rash all over body  . Dizziness and weakness   Immunizations Administered    Name Date Dose VIS Date Route   Moderna COVID-19 Vaccine 10/23/2019  9:10 AM 0.5 mL 05/2019 Intramuscular   Manufacturer: Moderna   Lot: [id number]   NDC: 24097-353-29

## 2019-12-11 ENCOUNTER — Other Ambulatory Visit (INDEPENDENT_AMBULATORY_CARE_PROVIDER_SITE_OTHER): Payer: Self-pay | Admitting: Internal Medicine

## 2019-12-11 NOTE — Telephone Encounter (Signed)
refill 

## 2019-12-11 NOTE — Telephone Encounter (Signed)
I think we are wellness but she needs to tell us.

## 2019-12-19 ENCOUNTER — Other Ambulatory Visit (INDEPENDENT_AMBULATORY_CARE_PROVIDER_SITE_OTHER): Payer: Self-pay

## 2019-12-23 ENCOUNTER — Other Ambulatory Visit (INDEPENDENT_AMBULATORY_CARE_PROVIDER_SITE_OTHER): Payer: Self-pay | Admitting: Internal Medicine

## 2019-12-23 MED ORDER — "NEEDLE (DISP) 25G X 1"" MISC": 100.0000 [IU] | 1 refills | Status: DC

## 2019-12-23 MED ORDER — NONFORMULARY OR COMPOUNDED ITEM: 5.0000 mg | 3 refills | Status: DC

## 2019-12-23 MED ORDER — "SYRINGE/NEEDLE (DISP) 25G X 5/8"" 1 ML MISC": 100.0000 [IU] | 1 refills | Status: DC

## 2019-12-24 ENCOUNTER — Ambulatory Visit (INDEPENDENT_AMBULATORY_CARE_PROVIDER_SITE_OTHER): Payer: Self-pay | Admitting: Internal Medicine

## 2020-01-20 ENCOUNTER — Encounter (INDEPENDENT_AMBULATORY_CARE_PROVIDER_SITE_OTHER): Payer: Self-pay | Admitting: Internal Medicine

## 2020-01-20 ENCOUNTER — Ambulatory Visit (INDEPENDENT_AMBULATORY_CARE_PROVIDER_SITE_OTHER): Payer: 59 | Admitting: Internal Medicine

## 2020-01-20 ENCOUNTER — Other Ambulatory Visit: Payer: Self-pay

## 2020-01-20 VITALS — BP 110/70 | HR 102 | Temp 97.3°F | Ht 61.0 in | Wt 198.4 lb

## 2020-01-20 DIAGNOSIS — E782 Mixed hyperlipidemia: Secondary | ICD-10-CM | POA: Diagnosis not present

## 2020-01-20 DIAGNOSIS — E039 Hypothyroidism, unspecified: Secondary | ICD-10-CM | POA: Diagnosis not present

## 2020-01-20 DIAGNOSIS — E2839 Other primary ovarian failure: Secondary | ICD-10-CM | POA: Diagnosis not present

## 2020-01-20 DIAGNOSIS — N951 Menopausal and female climacteric states: Secondary | ICD-10-CM

## 2020-01-20 NOTE — Progress Notes (Signed)
Metrics: Intervention Frequency ACO  Documented Smoking Status Yearly  Screened one or more times in 24 months  Cessation Counseling or  Active cessation medication Past 24 months  Past 24 months   Guideline developer: UpToDate (See UpToDate for funding source) Date Released: 2014       Wellness Office Visit  Subjective:  Patient ID: Lindsay Howe, female    DOB: 05-24-1961  Age: 59 y.o. MRN: [id number]  CC: This lady comes in for follow-up of hypothyroidism, menopausal symptoms, hyperlipidemia. HPI  Since we reduce the dose of NP thyroid to 90 mg daily, she is tolerating this very well.  She continues on bioidentical hormone therapy with estradiol, progesterone and testosterone therapy.  She is tolerating all these hormones. Unfortunately, her diet has not been consistent and she has not really lost any significant weight.  In fact, she has gained about 3 pounds since last time I saw her. Past Medical History:  Diagnosis Date  . Depression   . GERD (gastroesophageal reflux disease)   . HLD (hyperlipidemia) 05/29/2019  . Hot flashes due to menopause 05/29/2019  . Hyperlipemia   . Insomnia   . Obesity (BMI 30-39.9) 05/29/2019  . 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 Thyroid disease   . Vitamin D deficiency disease 05/29/2019   Past Surgical History:  Procedure Laterality Date  . BLADDER REPAIR    . BREAST ENHANCEMENT SURGERY    . CARPAL TUNNEL RELEASE     left  . COLONOSCOPY WITH ESOPHAGOGASTRODUODENOSCOPY (EGD)  07/05/2012   Procedure: COLONOSCOPY WITH ESOPHAGOGASTRODUODENOSCOPY (EGD);  Surgeon: Malissa Hippo, MD;  Location[facility];  Service: Endoscopy;  Laterality: N/A;  1200  . THYROIDECTOMY, PARTIAL    . TUBAL LIGATION       Family History  Problem Relation Age of Onset  . Hypothyroidism Mother   . Hypertension Mother   . Hyperlipidemia Mother   . Osteoporosis Mother   . Colon cancer Maternal Grandmother   . Stroke Maternal Grandfather     Social History     Social History Narrative   Married for 24 years. Has 2 kids (boy & girl). 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   Social History   Tobacco Use  . Smoking status: Former Smoker    Packs/day: 1.00    Years: 15.00    Pack years: 15.00    Types: Cigarettes    Quit date: 03/28/2011    Years since quitting: 8.8  . Smokeless tobacco: Never Used  Substance Use Topics  . Alcohol use: Yes    Alcohol/week: 3.0 standard drinks    Types: 3 Glasses of wine per week    Current Meds  Medication Sig  . ALPRAZolam (XANAX) 0.5 MG tablet Take 0.5 mg by mouth at bedtime as needed for sleep.   . Cholecalciferol (VITAMIN D-3) 125 MCG (5000 UT) TABS Take 2 tablets by mouth daily.  Marland Kitchen escitalopram (LEXAPRO) 20 MG tablet Take 20 mg by mouth daily.  Marland Kitchen estradiol (ESTRACE) 1 MG tablet TAKE ONE AND ONE-HALF TABLET (1.5MG  TOTAL) BY MOUTH DAILY  . NEEDLE, DISP, 25 G 25G X 1" MISC 100 Units by Does not apply route once a week.  . NONFORMULARY OR COMPOUNDED ITEM Inject 5 mg into the muscle 2 (two) times a week. Testosterone cypionate in olive  oil (25 mg/mL).  Dispense 5 mL vial.  . omeprazole (PRILOSEC) 20 MG capsule Take 20 mg by mouth daily as needed.   . pramipexole (MIRAPEX) 0.25 MG tablet Take  0.25 mg by mouth at bedtime. Usually twice a week  . progesterone (PROMETRIUM) 200 MG capsule TAKE ONE CAPSULE BY MOUTH EVERY NIGHT  . SYRINGE/NEEDLE, DISP, 1 ML 25G X 5/8" 1 ML MISC 100 Units by Does not apply route once a week.  . thyroid (NP THYROID) 90 MG tablet Take 1 tablet (90 mg total) by mouth daily.      Depression screen [facility] 2/9 09/17/2019  Decreased Interest 0  Down, Depressed, Hopeless 0  PHQ - 2 Score 0     Objective:   Today's Vitals: BP 110/70 (BP Location: Left Arm, Patient Position: Sitting, Cuff Size: Normal)   Pulse 102   Temp (!) 97.3 F (36.3 C) (Temporal)   Ht 5\' 1"  (1.549 m)   Wt 198 lb 6.4 oz (90 kg)   LMP 02/01/2012   SpO2 97%   BMI 37.49 kg/m  Vitals with BMI 01/20/2020  09/17/2019 07/22/2019  Height 5\' 1"  5\' 1"  5\' 1"   Weight 198 lbs 6 oz 195 lbs 10 oz 195 lbs  BMI 37.51 36.98 36.86  Systolic 110 115 07/24/2019  Diastolic 70 68 80  Pulse 102 83 99     Physical Exam  She looks systemically well.  She remains obese.  She has gained 3 pounds.  Blood pressure is excellent.     Assessment   1. Acquired hypothyroidism   2. Primary ovarian failure   3. Hot flashes due to menopause   4. Mixed hyperlipidemia       Tests ordered No orders of the defined types were placed in this encounter.    Plan: 1. She will continue with the same dose of NP thyroid for hypothyroidism. 2. She will continue with identical hormone therapy with estradiol, progesterone and testosterone as before. 3. She will continue to work on nutrition and today we talked about the blue zones and a plant-based diet together with the intermittent fasting.  She says she might be able to do this because she does not really eat much in the way of animal protein throughout the week. 4. Follow-up in 3 months when it would be time for an annual physical exam and we will do all the blood work then.   No orders of the defined types were placed in this encounter.   , MD

## 2020-01-22 ENCOUNTER — Other Ambulatory Visit (INDEPENDENT_AMBULATORY_CARE_PROVIDER_SITE_OTHER): Payer: Self-pay | Admitting: Internal Medicine

## 2020-02-14 ENCOUNTER — Other Ambulatory Visit (INDEPENDENT_AMBULATORY_CARE_PROVIDER_SITE_OTHER): Payer: Self-pay | Admitting: Internal Medicine

## 2020-04-21 ENCOUNTER — Encounter (INDEPENDENT_AMBULATORY_CARE_PROVIDER_SITE_OTHER): Payer: 59 | Admitting: Internal Medicine

## 2020-05-06 ENCOUNTER — Other Ambulatory Visit (INDEPENDENT_AMBULATORY_CARE_PROVIDER_SITE_OTHER): Payer: Self-pay | Admitting: Internal Medicine

## 2020-05-14 ENCOUNTER — Telehealth (INDEPENDENT_AMBULATORY_CARE_PROVIDER_SITE_OTHER): Payer: Self-pay

## 2020-05-14 NOTE — Telephone Encounter (Signed)
Patient called and left a VM that she wanted to schedule her follow up for her hormones and her medication refills. Patient prefers a Monday or a Tuesday.   Called patient and left a detailed voice message for patient to call back to schedule.

## 2020-05-18 ENCOUNTER — Other Ambulatory Visit (INDEPENDENT_AMBULATORY_CARE_PROVIDER_SITE_OTHER): Payer: Self-pay

## 2020-05-18 MED ORDER — NP THYROID 90 MG PO TABS: 90.0000 mg | ORAL_TABLET | Freq: Every day | ORAL | 3 refills | Status: DC

## 2020-05-18 MED ORDER — ESTRADIOL 1 MG PO TABS
1.5000 mg | ORAL_TABLET | Freq: Every day | ORAL | 3 refills | Status: DC
Start: 2020-05-18 — End: 2020-07-08

## 2020-06-25 ENCOUNTER — Other Ambulatory Visit (INDEPENDENT_AMBULATORY_CARE_PROVIDER_SITE_OTHER): Payer: Self-pay | Admitting: Internal Medicine

## 2020-07-07 ENCOUNTER — Other Ambulatory Visit: Payer: Self-pay

## 2020-07-07 ENCOUNTER — Ambulatory Visit (INDEPENDENT_AMBULATORY_CARE_PROVIDER_SITE_OTHER): Payer: 59 | Admitting: Internal Medicine

## 2020-07-07 ENCOUNTER — Encounter (INDEPENDENT_AMBULATORY_CARE_PROVIDER_SITE_OTHER): Payer: Self-pay | Admitting: Internal Medicine

## 2020-07-07 VITALS — BP 118/76 | HR 94 | Temp 98.1°F | Ht 61.75 in | Wt 195.2 lb

## 2020-07-07 DIAGNOSIS — E559 Vitamin D deficiency, unspecified: Secondary | ICD-10-CM

## 2020-07-07 DIAGNOSIS — E669 Obesity, unspecified: Secondary | ICD-10-CM

## 2020-07-07 DIAGNOSIS — Z0001 Encounter for general adult medical examination with abnormal findings: Secondary | ICD-10-CM

## 2020-07-07 DIAGNOSIS — N951 Menopausal and female climacteric states: Secondary | ICD-10-CM | POA: Diagnosis not present

## 2020-07-07 DIAGNOSIS — E039 Hypothyroidism, unspecified: Secondary | ICD-10-CM | POA: Diagnosis not present

## 2020-07-07 DIAGNOSIS — E2839 Other primary ovarian failure: Secondary | ICD-10-CM | POA: Diagnosis not present

## 2020-07-07 DIAGNOSIS — R5383 Other fatigue: Secondary | ICD-10-CM

## 2020-07-07 DIAGNOSIS — Z131 Encounter for screening for diabetes mellitus: Secondary | ICD-10-CM

## 2020-07-07 DIAGNOSIS — R5381 Other malaise: Secondary | ICD-10-CM

## 2020-07-07 DIAGNOSIS — E782 Mixed hyperlipidemia: Secondary | ICD-10-CM

## 2020-07-07 MED ORDER — TESTOSTERONE 1.62 % TD GEL: 5.0000 mg | Freq: Every day | TRANSDERMAL | 1 refills | Status: DC

## 2020-07-07 NOTE — Progress Notes (Signed)
Chief Complaint: This delightful 60 year old lady comes in for an annual physical exam and to address all chronic conditions below. HPI: She has a history of hypothyroidism, menopausal symptoms and continues to have hot flashes, dyslipidemia, obesity and vitamin D deficiency. She has been consistent with taking desiccated NP thyroid for hypothyroidism. She continues with estradiol, progesterone and testosterone.  She is not consistent with testosterone injections twice a week. She continues to get hot flashes.  She has not lost any significant weight since her last visit.  She wants to become healthier. 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  Past Medical History:  Diagnosis Date  . Depression   . GERD (gastroesophageal reflux disease)   . HLD (hyperlipidemia) 05/29/2019  . Hot flashes due to menopause 05/29/2019  . Hyperlipemia   . Insomnia   . Obesity (BMI 30-39.9) 05/29/2019  . 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 Thyroid disease   . Vitamin D deficiency disease 05/29/2019   Past Surgical History:  Procedure Laterality Date  . BLADDER REPAIR    . BREAST ENHANCEMENT SURGERY    . CARPAL TUNNEL RELEASE     left  . COLONOSCOPY WITH ESOPHAGOGASTRODUODENOSCOPY (EGD)  07/05/2012   Procedure: COLONOSCOPY WITH ESOPHAGOGASTRODUODENOSCOPY (EGD);  Surgeon: Malissa Hippo, MD;  Location[facility];  Service: Endoscopy;  Laterality: N/A;  1200  . THYROIDECTOMY, PARTIAL    . TUBAL LIGATION       Social History   Social History Narrative   Married for 25 years. Has 2 kids (boy & girl). 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    Social History   Tobacco Use  . Smoking status: Former Smoker    Packs/day: 1.00    Years: 15.00    Pack years: 15.00    Types: Cigarettes    Quit date: 03/28/2011    Years since quitting: 9.2  . Smokeless tobacco: Never Used  Substance  Use Topics  . Alcohol use: Yes    Alcohol/week: 3.0 standard drinks    Types: 3 Glasses of wine per week      Allergies:  Allergies  Allergen Reactions  . No Known Allergies      Current Meds  Medication Sig  . ALPRAZolam (XANAX) 0.5 MG tablet Take 0.5 mg by mouth at bedtime as needed for sleep.   . Cholecalciferol (VITAMIN D-3) 125 MCG (5000 UT) TABS Take 2 tablets by mouth daily.  Marland Kitchen dicyclomine (BENTYL) 10 MG capsule Take 10 mg by mouth daily.  Marland Kitchen escitalopram (LEXAPRO) 20 MG tablet Take 20 mg by mouth daily.  Marland Kitchen estradiol (ESTRACE) 1 MG tablet Take 1.5 tablets (1.5 mg total) by mouth daily.  Marland Kitchen NEEDLE, DISP, 25 G 25G X 1" MISC 100 Units by Does not apply route once a week.  . NONFORMULARY OR COMPOUNDED ITEM Inject 5 mg into the muscle 2 (two) times a week. Testosterone cypionate in olive  oil (25 mg/mL).  Dispense 5 mL vial.  . omeprazole (PRILOSEC) 20 MG capsule Take 20 mg by mouth daily as needed.   . pramipexole (MIRAPEX) 0.25 MG tablet Take 0.25 mg by mouth at bedtime. Usually twice a week  . progesterone (PROMETRIUM) 200 MG capsule TAKE ONE CAPSULE BY MOUTH EVERY NIGHT  . Testosterone 1.62 % GEL Place 5 mg onto the skin daily.  . [DISCONTINUED] SYRINGE/NEEDLE, DISP, 1 ML 25G X 5/8" 1 ML MISC 100 Units by  Does not apply route once a week.       Depression screen [facility] 2/9 07/07/2020 09/17/2019  Decreased Interest 2 0  Down, Depressed, Hopeless 3 0  PHQ - 2 Score 5 0  Altered sleeping 3 -  Tired, decreased energy 3 -  Change in appetite 1 -  Feeling bad or failure about yourself  0 -  Trouble concentrating 2 -  Moving slowly or fidgety/restless 0 -  Suicidal thoughts 0 -  PHQ-9 Score 14 -  Difficult doing work/chores Somewhat difficult -     STM:HDQQI from the symptoms mentioned above,there are no other symptoms referable to all systems reviewed.   Physical Exam: Blood pressure 118/76, pulse 94, temperature 98.1 F (36.7 C), temperature source Temporal, height 5'  1.75" (1.568 m), weight 195 lb 3.2 oz (88.5 kg), last menstrual period 02/01/2012, SpO2 98 %. Vitals with BMI 07/07/2020 01/20/2020 09/17/2019  Height 5' 1.75" 5\' 1"  5\' 1"   Weight 195 lbs 3 oz 198 lbs 6 oz 195 lbs 10 oz  BMI 36.01 37.51 36.98  Systolic 118 110  Diastolic 76 70 68  Pulse 94 102 83      She look systemically well but is obese.  She has lost 3 pounds since the last visit. General: Alert, cooperative, and appears to be the stated age.No pallor.  No jaundice.  No clubbing. Head: Normocephalic Eyes: Sclera white, pupils equal and reactive to light, red reflex x 2,  Ears: Normal bilaterally Oral cavity: Lips, mucosa, and tongue normal: Teeth and gums normal Neck: No adenopathy, supple, symmetrical, trachea midline, and thyroid does not appear enlarged. Breast: No masses felt. Respiratory: Clear to auscultation bilaterally.No wheezing, crackles or bronchial breathing. Cardiovascular: Heart sounds are present and appear to be normal without murmurs or added sounds.  No carotid bruits.  Peripheral pulses are present and equal bilaterally.: Gastrointestinal:positive bowel sounds, no hepatosplenomegaly.  No masses felt.No tenderness. Skin: Clear, No rashes noted.No worrisome skin lesions seen. Neurological: Grossly intact without focal findings, cranial nerves II through XII intact, muscle strength equal bilaterally Musculoskeletal: No acute joint abnormalities noted.Full range of movement noted with joints. Psychiatric: Affect appropriate, non-anxious.    Assessment  1. Encounter for general adult medical examination with abnormal findings   2. Primary ovarian failure   3. Acquired hypothyroidism   4. Hot flashes due to menopause   5. Obesity (BMI 30-39.9)   6. Mixed hyperlipidemia   7. Malaise and fatigue   8. Screening for diabetes mellitus   9. Vitamin D deficiency disease     Tests Ordered:   Orders Placed This Encounter  Procedures  . CBC  . COMPLETE  METABOLIC PANEL WITH GFR  . Hemoglobin A1c  . Lipid panel  . Progesterone  . Estradiol  . T3, free  . TSH  . VITAMIN D 25 Hydroxy (Vit-D Deficiency, Fractures)     Plan  50. 60 year old lady with hypothyroidism, obesity and menopausal symptoms. 2. Blood work is ordered. 3. I suspect we will need to further optimize estradiol and progesterone. 4. We discussed testosterone therapy and her inconsistency with that.  I discussed the use of testosterone cream and she is willing to do this instead so I have called in a prescription to 2 apothe[facility] for testosterone cream 5 mg applied to the labia daily. 5. Follow-up in 2 months to see how she is doing and further commendations will depend on blood results. 6. Today, in addition to an preventative visit, I performed an office  visit to address her conditions above including testosterone therapy.     Meds ordered this encounter  Medications  . Testosterone 1.62 % GEL    Sig: Place 5 mg onto the skin daily.    Dispense:  30 g    Refill:  1     Icholas Irby C Edric Fetterman   07/07/2020, 3:23 PM

## 2020-07-08 ENCOUNTER — Other Ambulatory Visit (INDEPENDENT_AMBULATORY_CARE_PROVIDER_SITE_OTHER): Payer: Self-pay | Admitting: Internal Medicine

## 2020-07-08 LAB — CBC
HCT: 42.4 % (ref 35.0–45.0)
Hemoglobin: 14.9 g/dL (ref 11.7–15.5)
MCH: 32.1 pg (ref 27.0–33.0)
MCHC: 35.1 g/dL (ref 32.0–36.0)
MCV: 91.4 fL (ref 80.0–100.0)
MPV: 11.1 fL (ref 7.5–12.5)
Platelets: 305 10*3/uL (ref 140–400)
RBC: 4.64 10*6/uL (ref 3.80–5.10)
RDW: 12 % (ref 11.0–15.0)
WBC: 6.7 10*3/uL (ref 3.8–10.8)

## 2020-07-08 LAB — COMPLETE METABOLIC PANEL WITH GFR
AG Ratio: 1.5 (calc) (ref 1.0–2.5)
ALT: 15 U/L (ref 6–29)
AST: 15 U/L (ref 10–35)
Albumin: 4.2 g/dL (ref 3.6–5.1)
Alkaline phosphatase (APISO): 65 U/L (ref 37–153)
BUN: 18 mg/dL (ref 7–25)
CO2: 25 mmol/L (ref 20–32)
Calcium: 9.6 mg/dL (ref 8.6–10.4)
Chloride: 106 mmol/L (ref 98–110)
Creat: 0.71 mg/dL (ref 0.50–1.05)
GFR, Est African American: 108 mL/min/{1.73_m2} (ref >=60)
GFR, Est Non African American: 93 mL/min/{1.73_m2} (ref >=60)
Globulin: 2.8 g/dL (calc) (ref 1.9–3.7)
Glucose, Bld: 94 mg/dL (ref 65–139)
Potassium: 4.2 mmol/L (ref 3.5–5.3)
Sodium: 139 mmol/L (ref 135–146)
Total Bilirubin: 0.3 mg/dL (ref 0.2–1.2)
Total Protein: 7 g/dL (ref 6.1–8.1)

## 2020-07-08 LAB — LIPID PANEL
Cholesterol: 242 mg/dL — ABNORMAL HIGH (ref <200)
HDL: 55 mg/dL (ref >=50)
LDL Cholesterol (Calc): 151 mg/dL (calc) — ABNORMAL HIGH
Non-HDL Cholesterol (Calc): 187 mg/dL (calc) — ABNORMAL HIGH (ref <130)
Total CHOL/HDL Ratio: 4.4 (calc) (ref <5.0)
Triglycerides: 218 mg/dL — ABNORMAL HIGH (ref <150)

## 2020-07-08 LAB — T3, FREE: T3, Free: 4.9 pg/mL — ABNORMAL HIGH (ref 2.3–4.2)

## 2020-07-08 LAB — VITAMIN D 25 HYDROXY (VIT D DEFICIENCY, FRACTURES): Vit D, 25-Hydroxy: 68 ng/mL (ref 30–100)

## 2020-07-08 LAB — HEMOGLOBIN A1C
Hgb A1c MFr Bld: 5.5 % of total Hgb (ref <5.7)
Mean Plasma Glucose: 111 mg/dL
eAG (mmol/L): 6.2 mmol/L

## 2020-07-08 LAB — PROGESTERONE: Progesterone: 12 ng/mL

## 2020-07-08 LAB — ESTRADIOL: Estradiol: 33 pg/mL

## 2020-07-08 LAB — TSH: TSH: 2.87 mIU/L (ref 0.40–4.50)

## 2020-07-08 MED ORDER — NP THYROID 120 MG PO TABS: 120.0000 mg | ORAL_TABLET | Freq: Every day | ORAL | 3 refills | Status: DC

## 2020-07-08 MED ORDER — ESTRADIOL 2 MG PO TABS: 2.0000 mg | ORAL_TABLET | Freq: Every day | ORAL | 3 refills | Status: DC

## 2020-07-13 NOTE — Progress Notes (Signed)
Please call thei patient and read her the [facility] message I sent her,thanks.

## 2020-07-15 NOTE — Progress Notes (Signed)
Pt was called ;given message CHECK HER MYCHART.

## 2020-09-07 ENCOUNTER — Ambulatory Visit (INDEPENDENT_AMBULATORY_CARE_PROVIDER_SITE_OTHER): Payer: 59 | Admitting: Internal Medicine

## 2020-11-27 ENCOUNTER — Other Ambulatory Visit (INDEPENDENT_AMBULATORY_CARE_PROVIDER_SITE_OTHER): Payer: Self-pay | Admitting: Internal Medicine

## 2020-12-07 ENCOUNTER — Encounter (INDEPENDENT_AMBULATORY_CARE_PROVIDER_SITE_OTHER): Payer: Self-pay | Admitting: Internal Medicine

## 2020-12-07 ENCOUNTER — Other Ambulatory Visit: Payer: Self-pay

## 2020-12-07 ENCOUNTER — Ambulatory Visit (INDEPENDENT_AMBULATORY_CARE_PROVIDER_SITE_OTHER): Payer: 59 | Admitting: Internal Medicine

## 2020-12-07 VITALS — BP 134/86 | HR 95 | Temp 97.3°F | Ht 61.75 in | Wt 201.4 lb

## 2020-12-07 DIAGNOSIS — E2839 Other primary ovarian failure: Secondary | ICD-10-CM | POA: Diagnosis not present

## 2020-12-07 DIAGNOSIS — E782 Mixed hyperlipidemia: Secondary | ICD-10-CM | POA: Diagnosis not present

## 2020-12-07 DIAGNOSIS — N951 Menopausal and female climacteric states: Secondary | ICD-10-CM | POA: Diagnosis not present

## 2020-12-07 DIAGNOSIS — E039 Hypothyroidism, unspecified: Secondary | ICD-10-CM

## 2020-12-07 NOTE — Progress Notes (Signed)
Metrics: Intervention Frequency ACO  Documented Smoking Status Yearly  Screened one or more times in 24 months  Cessation Counseling or  Active cessation medication Past 24 months  Past 24 months   Guideline developer: UpToDate (See UpToDate for funding source) Date Released: 2014       Wellness Office Visit  Subjective:  Patient ID: Lindsay Howe, female    DOB: July 09, 1960  Age: 60 y.o. MRN: [id number]  CC: This lady comes in for follow-up of hypothyroidism, dyslipidemia, bioidentical hormone therapy with menopausal symptoms. HPI  Since starting testosterone therapy, she feels better in terms of energy, sex drive.  She says she in fact had to reduce the testosterone cream to every other day because her husband was too tired to have sex with her. She has tolerated the higher dose of NP thyroid as well as higher dose of estradiol. Overall, she feels much better but has been binge eating more lately because of stress.  This is caused her to gain weight. 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   Thyroid disease    Vitamin D deficiency disease 05/29/2019   Past Surgical History:  Procedure Laterality Date   BLADDER REPAIR     BREAST ENHANCEMENT SURGERY     CARPAL TUNNEL RELEASE     left   COLONOSCOPY WITH ESOPHAGOGASTRODUODENOSCOPY (EGD)  07/05/2012   Procedure: COLONOSCOPY WITH ESOPHAGOGASTRODUODENOSCOPY (EGD);  Surgeon: Malissa Hippo, MD;  [facility]: [facility];  Service: Endoscopy;  Laterality: N/A;  1200   THYROIDECTOMY, PARTIAL     TUBAL LIGATION       Family History  Problem Relation Age of Onset   Hypothyroidism Mother    Hypertension Mother    Hyperlipidemia Mother    Osteoporosis Mother    Colon cancer Maternal Grandmother    Stroke  Maternal Grandfather    Crohn's disease Son     Social History   Social History Narrative   Married for 25 years. Has 2 kids (boy & girl). 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   Social History   Tobacco Use   Smoking status: Former    Packs/day: 1.00    Years: 15.00    Pack years: 15.00    Types: Cigarettes    Quit date: 03/28/2011    Years since quitting: 9.7   Smokeless tobacco: Never  Substance Use Topics   Alcohol use: Yes    Alcohol/week: 3.0 standard drinks    Types: 3 Glasses of wine per week    Current Meds  Medication Sig   ALPRAZolam (XANAX) 0.5 MG tablet Take 0.5 mg by mouth at bedtime as needed for sleep.    Cholecalciferol (VITAMIN D-3) 125 MCG (5000 UT) TABS Take 2 tablets by mouth daily.   dicyclomine (BENTYL) 10 MG capsule Take 10 mg by mouth daily.   escitalopram (LEXAPRO) 20 MG tablet Take 20 mg by mouth daily.   estradiol (ESTRACE) 2 MG tablet Take 1 tablet (2 mg total) by mouth daily.   NP THYROID 120 MG tablet TAKE 1 TABLET (120MG  TOTAL) BY MOUTH DAILY BEFORE BREAKFAST   omeprazole (PRILOSEC) 20 MG capsule Take 20 mg by mouth daily as needed.    pramipexole (MIRAPEX) 0.25 MG tablet Take 0.25 mg by mouth  at bedtime. Usually twice a week   progesterone (PROMETRIUM) 200 MG capsule TAKE ONE CAPSULE BY MOUTH EVERY NIGHT   Testosterone 1.62 % GEL Place 5 mg onto the skin daily.     Flowsheet Row Office Visit from 12/07/2020 in [facility]  PHQ-9 Total Score 3       Objective:   Today's Vitals: BP 134/86   Pulse 95   Temp (!) 97.3 F (36.3 C) (Temporal)   Ht 5' 1.75" (1.568 m)   Wt 201 lb 6.4 oz (91.4 kg)   LMP 02/01/2012   SpO2 96%   BMI 37.14 kg/m  Vitals with BMI 12/07/2020 07/07/2020 01/20/2020  Height 5' 1.75" 5' 1.75" 5\' 1"   Weight 201 lbs 6 oz 195 lbs 3 oz 198 lbs 6 oz  BMI 37.16 36.01 37.51  Systolic 134 118  Diastolic 86 76 70  Pulse 95 94 102     Physical Exam  She looks systemically well.  She remains obese and has  gained 6 pounds since last visit.     Assessment   1. Acquired hypothyroidism   2. Mixed hyperlipidemia   3. Primary ovarian failure   4. Hot flashes due to menopause       Tests ordered Orders Placed This Encounter  Procedures   DHEA-sulfate   Estradiol   Progesterone   T3, free   T4, free   TSH   Testos,Total,Free and SHBG (Female)   Lipid panel      Plan: 1.  Continue with NP thyroid 120 mg daily and we will check thyroid function today.  We may need to adjust upwards. 2.  I will check a lipid panel to see the effects of hormone treatment even though she is eating somewhat suboptimally at the present time. 3.  Continue with estradiol and progesterone doses at the current dose and we will check levels today. 4.  I have encouraged her to take testosterone cream every day because testosterone therapy is shown to reduce visceral fat.  Once she understood this, I think she will likely take testosterone every day. 5.  Follow-up in about 6 weeks.    No orders of the defined types were placed in this encounter.   097, MD

## 2020-12-10 LAB — DHEA-SULFATE: DHEA-SO4: 47 ug/dL (ref 5–167)

## 2020-12-10 LAB — TESTOS,TOTAL,FREE AND SHBG (FEMALE)
Free Testosterone: 1.3 pg/mL (ref 0.1–6.4)
Sex Hormone Binding: 81 nmol/L — ABNORMAL HIGH (ref 14–73)
Testosterone, Total, LC-MS-MS: 16 ng/dL (ref 2–45)

## 2020-12-10 LAB — T3, FREE: T3, Free: 6.3 pg/mL — ABNORMAL HIGH (ref 2.3–4.2)

## 2020-12-10 LAB — LIPID PANEL
Cholesterol: 235 mg/dL — ABNORMAL HIGH (ref <200)
HDL: 67 mg/dL (ref >=50)
LDL Cholesterol (Calc): 144 mg/dL (calc) — ABNORMAL HIGH
Non-HDL Cholesterol (Calc): 168 mg/dL (calc) — ABNORMAL HIGH (ref <130)
Total CHOL/HDL Ratio: 3.5 (calc) (ref <5.0)
Triglycerides: 119 mg/dL (ref <150)

## 2020-12-10 LAB — ESTRADIOL: Estradiol: 45 pg/mL

## 2020-12-10 LAB — TSH: TSH: 3.22 mIU/L (ref 0.40–4.50)

## 2020-12-10 LAB — PROGESTERONE: Progesterone: 22.1 ng/mL

## 2020-12-10 LAB — T4, FREE: Free T4: 1.1 ng/dL (ref 0.8–1.8)

## 2020-12-11 ENCOUNTER — Other Ambulatory Visit (INDEPENDENT_AMBULATORY_CARE_PROVIDER_SITE_OTHER): Payer: Self-pay | Admitting: Internal Medicine

## 2021-01-19 ENCOUNTER — Encounter (INDEPENDENT_AMBULATORY_CARE_PROVIDER_SITE_OTHER): Payer: Self-pay | Admitting: Internal Medicine

## 2021-01-19 ENCOUNTER — Other Ambulatory Visit: Payer: Self-pay

## 2021-01-19 ENCOUNTER — Ambulatory Visit (INDEPENDENT_AMBULATORY_CARE_PROVIDER_SITE_OTHER): Payer: 59 | Admitting: Internal Medicine

## 2021-01-19 VITALS — BP 122/80 | HR 80 | Temp 97.9°F | Resp 18 | Ht 61.0 in | Wt 201.8 lb

## 2021-01-19 DIAGNOSIS — E782 Mixed hyperlipidemia: Secondary | ICD-10-CM | POA: Diagnosis not present

## 2021-01-19 DIAGNOSIS — N951 Menopausal and female climacteric states: Secondary | ICD-10-CM

## 2021-01-19 DIAGNOSIS — E2839 Other primary ovarian failure: Secondary | ICD-10-CM

## 2021-01-19 DIAGNOSIS — E039 Hypothyroidism, unspecified: Secondary | ICD-10-CM | POA: Diagnosis not present

## 2021-01-19 MED ORDER — ESTRADIOL 0.5 MG PO TABS: 0.5000 mg | ORAL_TABLET | Freq: Every day | ORAL | 3 refills | Status: DC

## 2021-01-19 NOTE — Progress Notes (Signed)
Metrics: Intervention Frequency ACO  Documented Smoking Status Yearly  Screened one or more times in 24 months  Cessation Counseling or  Active cessation medication Past 24 months  Past 24 months   Guideline developer: UpToDate (See UpToDate for funding source) Date Released: 2014       Wellness Office Visit  Subjective:  Patient ID: Lindsay Howe, female    DOB: [birth year]  Age: 60 y.o. MRN: [id number]  CC: This lady comes in for follow-up of BH RT. HPI  She is now taking testosterone cream every day.  She still continues to get hot flashes.  I reviewed all her blood work with her today which shows suboptimal estradiol levels but other hormone levels were optimal except for testosterone.  When the level was taken, she was not taking testosterone every day. Her lipid panel shows improvement in cholesterol numbers. She has been on vacation and has not been consistent with nutrition. 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   Thyroid disease    Vitamin D deficiency disease 05/29/2019   Past Surgical History:  Procedure Laterality Date   BLADDER REPAIR     BREAST ENHANCEMENT SURGERY     CARPAL TUNNEL RELEASE     left   COLONOSCOPY WITH ESOPHAGOGASTRODUODENOSCOPY (EGD)  07/05/2012   Procedure: COLONOSCOPY WITH ESOPHAGOGASTRODUODENOSCOPY (EGD);  Surgeon: Malissa Hippo, MD;  [facility];  Service: Endoscopy;  Laterality: N/A;  1200   THYROIDECTOMY, PARTIAL     TUBAL LIGATION       Family History  Problem Relation Age of Onset   Hypothyroidism Mother    Hypertension Mother    Hyperlipidemia Mother    Osteoporosis Mother    Colon cancer Maternal Grandmother    Stroke Maternal Grandfather    Crohn's disease Son     Social History    Social History Narrative   Married for 25 years. Has 2 kids (boy & girl). 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   Social History   Tobacco Use   Smoking status: Former    Packs/day: 1.00    Years: 15.00    Pack years: 15.00    Types: Cigarettes    Quit date: 03/28/2011    Years since quitting: 9.8   Smokeless tobacco: Never  Substance Use Topics   Alcohol use: Yes    Alcohol/week: 3.0 standard drinks    Types: 3 Glasses of wine per week    Current Meds  Medication Sig   ALPRAZolam (XANAX) 0.5 MG tablet Take 0.5 mg by mouth at bedtime as needed for sleep.    Cholecalciferol (VITAMIN D-3) 125 MCG (5000 UT) TABS Take 2 tablets by mouth daily.   dicyclomine (BENTYL) 10 MG capsule Take 10 mg by mouth daily.   escitalopram (LEXAPRO) 20 MG tablet Take 20 mg by mouth daily.   estradiol (ESTRACE) 0.5 MG tablet Take 1 tablet (0.5 mg total) by mouth daily.   estradiol (ESTRACE) 2 MG tablet TAKE ONE (1) TABLET BY MOUTH EVERY DAY   NEEDLE, DISP, 25 G 25G X 1" MISC 100 Units by Does not apply route once a week.   NONFORMULARY OR COMPOUNDED ITEM Inject 5 mg into the muscle 2 (two) times a week. Testosterone cypionate in olive  oil (25 mg/mL).  Dispense 5 mL vial. (Patient taking differently: Inject 5 mg into the muscle 2 (two) times a week. Testosterone cypionate in olive  oil (25 mg/mL).  Dispense 5 mL vial.)   NP THYROID 120 MG tablet TAKE 1 TABLET (120MG  TOTAL) BY MOUTH DAILY BEFORE BREAKFAST   omeprazole (PRILOSEC) 20 MG capsule Take 20 mg by mouth daily as needed.    pramipexole (MIRAPEX) 0.25 MG tablet Take 0.25 mg by mouth at bedtime. Usually twice a week   Testosterone 1.62 % GEL Place 5 mg onto the skin daily.     Flowsheet Row Office Visit from 12/07/2020 in [facility]  PHQ-9 Total Score 3       Objective:   Today's Vitals: BP 122/80 (BP Location: Right Arm, Patient Position: Sitting, Cuff Size: Normal)   Pulse 80   Temp 97.9 F (36.6 C) (Temporal)   Resp 18    Ht 5\' 1"  (1.549 m)   Wt 201 lb 12.8 oz (91.5 kg)   LMP 02/01/2012   SpO2 98%   BMI 38.13 kg/m  Vitals with BMI 01/19/2021 12/07/2020 07/07/2020  Height 5\' 1"  5' 1.75" 5' 1.75"  Weight 201 lbs 13 oz 201 lbs 6 oz 195 lbs 3 oz  BMI 38.15 37.16 36.01  Systolic 122 134 12/09/2020  Diastolic 80 86 76  Pulse 80 95 94     Physical Exam  She remains obese, has not lost any weight.  Blood pressure is better.     Assessment   1. Primary ovarian failure   2. Acquired hypothyroidism   3. Mixed hyperlipidemia   4. Hot flashes due to menopause       Tests ordered No orders of the defined types were placed in this encounter.    Plan: 1.  I am going to increase her estradiol so that she will be taking estradiol 2.5 mg daily by addition of estradiol 0.5 mg tablet to her estradiol 2 mg tablet. Continue with the same dose of progesterone. 2.  She is taking testosterone cream every day now. 3.  Continue with NP thyroid as before. 4.  Focus on nutrition and we discussed using apple cider vinegar with her water to help her fast for longer periods of time. 5.  Follow-up in a couple of months.    Meds ordered this encounter  Medications   estradiol (ESTRACE) 0.5 MG tablet    Sig: Take 1 tablet (0.5 mg total) by mouth daily.    Dispense:  30 tablet    Refill:  3    Cutberto Winfree 09/04/2020, MD

## 2021-03-23 ENCOUNTER — Ambulatory Visit (INDEPENDENT_AMBULATORY_CARE_PROVIDER_SITE_OTHER): Payer: 59 | Admitting: Internal Medicine

## 2022-03-24 DIAGNOSIS — I10 Essential (primary) hypertension: Secondary | ICD-10-CM | POA: Diagnosis not present

## 2022-03-24 DIAGNOSIS — G2581 Restless legs syndrome: Secondary | ICD-10-CM | POA: Diagnosis not present

## 2022-03-24 DIAGNOSIS — E6609 Other obesity due to excess calories: Secondary | ICD-10-CM | POA: Diagnosis not present

## 2022-03-24 DIAGNOSIS — E063 Autoimmune thyroiditis: Secondary | ICD-10-CM | POA: Diagnosis not present

## 2022-03-24 DIAGNOSIS — R69 Illness, unspecified: Secondary | ICD-10-CM | POA: Diagnosis not present

## 2022-03-24 DIAGNOSIS — Z6834 Body mass index (BMI) 34.0-34.9, adult: Secondary | ICD-10-CM | POA: Diagnosis not present

## 2022-03-24 DIAGNOSIS — Z23 Encounter for immunization: Secondary | ICD-10-CM | POA: Diagnosis not present

## 2022-05-03 DIAGNOSIS — N951 Menopausal and female climacteric states: Secondary | ICD-10-CM | POA: Diagnosis not present

## 2022-05-31 DIAGNOSIS — R69 Illness, unspecified: Secondary | ICD-10-CM | POA: Diagnosis not present

## 2022-05-31 DIAGNOSIS — Z87891 Personal history of nicotine dependence: Secondary | ICD-10-CM | POA: Diagnosis not present

## 2022-05-31 DIAGNOSIS — Z6835 Body mass index (BMI) 35.0-35.9, adult: Secondary | ICD-10-CM | POA: Diagnosis not present

## 2022-05-31 DIAGNOSIS — E039 Hypothyroidism, unspecified: Secondary | ICD-10-CM | POA: Diagnosis not present

## 2022-05-31 DIAGNOSIS — E669 Obesity, unspecified: Secondary | ICD-10-CM | POA: Diagnosis not present

## 2022-05-31 DIAGNOSIS — L309 Dermatitis, unspecified: Secondary | ICD-10-CM | POA: Diagnosis not present

## 2022-05-31 DIAGNOSIS — R32 Unspecified urinary incontinence: Secondary | ICD-10-CM | POA: Diagnosis not present

## 2022-08-30 DIAGNOSIS — Z6832 Body mass index (BMI) 32.0-32.9, adult: Secondary | ICD-10-CM | POA: Diagnosis not present

## 2022-08-30 DIAGNOSIS — E063 Autoimmune thyroiditis: Secondary | ICD-10-CM | POA: Diagnosis not present

## 2022-08-30 DIAGNOSIS — E6609 Other obesity due to excess calories: Secondary | ICD-10-CM | POA: Diagnosis not present

## 2022-08-30 DIAGNOSIS — I10 Essential (primary) hypertension: Secondary | ICD-10-CM | POA: Diagnosis not present

## 2022-08-30 DIAGNOSIS — F330 Major depressive disorder, recurrent, mild: Secondary | ICD-10-CM | POA: Diagnosis not present

## 2022-11-30 DIAGNOSIS — E669 Obesity, unspecified: Secondary | ICD-10-CM | POA: Diagnosis not present

## 2022-11-30 DIAGNOSIS — E063 Autoimmune thyroiditis: Secondary | ICD-10-CM | POA: Diagnosis not present

## 2022-11-30 DIAGNOSIS — Z6829 Body mass index (BMI) 29.0-29.9, adult: Secondary | ICD-10-CM | POA: Diagnosis not present

## 2022-11-30 DIAGNOSIS — I10 Essential (primary) hypertension: Secondary | ICD-10-CM | POA: Diagnosis not present

## 2023-02-08 DIAGNOSIS — Z87891 Personal history of nicotine dependence: Secondary | ICD-10-CM | POA: Diagnosis not present

## 2023-02-08 DIAGNOSIS — F324 Major depressive disorder, single episode, in partial remission: Secondary | ICD-10-CM | POA: Diagnosis not present

## 2023-02-08 DIAGNOSIS — F419 Anxiety disorder, unspecified: Secondary | ICD-10-CM | POA: Diagnosis not present

## 2023-02-08 DIAGNOSIS — Z6830 Body mass index (BMI) 30.0-30.9, adult: Secondary | ICD-10-CM | POA: Diagnosis not present

## 2023-02-08 DIAGNOSIS — K589 Irritable bowel syndrome without diarrhea: Secondary | ICD-10-CM | POA: Diagnosis not present

## 2023-02-08 DIAGNOSIS — E669 Obesity, unspecified: Secondary | ICD-10-CM | POA: Diagnosis not present

## 2023-02-08 DIAGNOSIS — E039 Hypothyroidism, unspecified: Secondary | ICD-10-CM | POA: Diagnosis not present

## 2023-04-21 DIAGNOSIS — E063 Autoimmune thyroiditis: Secondary | ICD-10-CM | POA: Diagnosis not present

## 2023-05-29 LAB — RESULTS CONSOLE HPV: CHL HPV: NEGATIVE

## 2023-05-29 LAB — HM PAP SMEAR

## 2023-05-30 DIAGNOSIS — Z01419 Encounter for gynecological examination (general) (routine) without abnormal findings: Secondary | ICD-10-CM | POA: Diagnosis not present

## 2023-05-30 DIAGNOSIS — Z1231 Encounter for screening mammogram for malignant neoplasm of breast: Secondary | ICD-10-CM | POA: Diagnosis not present

## 2023-05-30 DIAGNOSIS — Z1151 Encounter for screening for human papillomavirus (HPV): Secondary | ICD-10-CM | POA: Diagnosis not present

## 2023-05-30 DIAGNOSIS — Z124 Encounter for screening for malignant neoplasm of cervix: Secondary | ICD-10-CM | POA: Diagnosis not present

## 2023-05-30 DIAGNOSIS — Z6829 Body mass index (BMI) 29.0-29.9, adult: Secondary | ICD-10-CM | POA: Diagnosis not present

## 2023-06-13 DIAGNOSIS — Z1331 Encounter for screening for depression: Secondary | ICD-10-CM | POA: Diagnosis not present

## 2023-06-13 DIAGNOSIS — R011 Cardiac murmur, unspecified: Secondary | ICD-10-CM | POA: Diagnosis not present

## 2023-06-13 DIAGNOSIS — I10 Essential (primary) hypertension: Secondary | ICD-10-CM | POA: Diagnosis not present

## 2023-06-13 DIAGNOSIS — E063 Autoimmune thyroiditis: Secondary | ICD-10-CM | POA: Diagnosis not present

## 2023-06-13 DIAGNOSIS — Z6827 Body mass index (BMI) 27.0-27.9, adult: Secondary | ICD-10-CM | POA: Diagnosis not present

## 2023-06-13 DIAGNOSIS — Z0001 Encounter for general adult medical examination with abnormal findings: Secondary | ICD-10-CM | POA: Diagnosis not present

## 2023-06-13 DIAGNOSIS — R7309 Other abnormal glucose: Secondary | ICD-10-CM | POA: Diagnosis not present

## 2023-06-13 DIAGNOSIS — R7989 Other specified abnormal findings of blood chemistry: Secondary | ICD-10-CM | POA: Diagnosis not present

## 2023-06-13 DIAGNOSIS — E663 Overweight: Secondary | ICD-10-CM | POA: Diagnosis not present

## 2023-06-14 ENCOUNTER — Other Ambulatory Visit (HOSPITAL_COMMUNITY): Payer: Self-pay | Admitting: Internal Medicine

## 2023-06-14 DIAGNOSIS — R7989 Other specified abnormal findings of blood chemistry: Secondary | ICD-10-CM

## 2023-09-12 ENCOUNTER — Ambulatory Visit: Payer: Self-pay | Attending: Internal Medicine | Admitting: Internal Medicine

## 2023-09-12 ENCOUNTER — Encounter: Payer: Self-pay | Admitting: Internal Medicine

## 2023-09-12 VITALS — BP 114/78 | HR 89 | Ht 61.0 in | Wt 156.8 lb

## 2023-09-12 DIAGNOSIS — Z136 Encounter for screening for cardiovascular disorders: Secondary | ICD-10-CM | POA: Diagnosis not present

## 2023-09-12 DIAGNOSIS — R010 Benign and innocent cardiac murmurs: Secondary | ICD-10-CM | POA: Diagnosis not present

## 2023-09-12 NOTE — Patient Instructions (Signed)

## 2023-09-12 NOTE — Progress Notes (Signed)
 Cardiology Office Note  Date: 09/12/2023   ID: Lindsay Howe, DOB Feb 10, 1961, MRN [id number]  PCP:  Elfredia Nevins, MD  Cardiologist:  Marjo Bicker, MD Electrophysiologist:  None   History of Present Illness: Lindsay Howe is a 63 y.o. female known to have GERD, hypothyroidism was referred to cardiology clinic for evaluation of heart murmur.  Doing great, no symptoms of angina, DOE, dizziness, presyncope, syncope, palpitations, leg swelling.  No history of CAD in the family or personally.  Does not smoke cigarettes.  Drinks in the weekends.  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   Thyroid disease    Vitamin D deficiency disease 05/29/2019    Past Surgical History:  Procedure Laterality Date   BLADDER REPAIR     BREAST ENHANCEMENT SURGERY     CARPAL TUNNEL RELEASE     left   COLONOSCOPY WITH ESOPHAGOGASTRODUODENOSCOPY (EGD)  07/05/2012   Procedure: COLONOSCOPY WITH ESOPHAGOGASTRODUODENOSCOPY (EGD);  Surgeon: Malissa Hippo, MD;  [facility];  Service: Endoscopy;  Laterality: N/A;  1200   THYROIDECTOMY, PARTIAL     TUBAL LIGATION      Current Outpatient Medications  Medication Sig Dispense Refill   ALPRAZolam (XANAX) 0.5 MG tablet Take 0.5 mg by mouth at bedtime as needed for sleep.      dicyclomine (BENTYL) 10 MG capsule Take 10 mg by mouth daily.     escitalopram (LEXAPRO) 20 MG tablet Take 20 mg by mouth daily.     levothyroxine (SYNTHROID) 112 MCG tablet Take 112 mcg by mouth daily.     NEEDLE, DISP, 25 G 25G X 1" MISC 100 Units by Does not apply route once a week. 100 each 1   omeprazole (PRILOSEC) 20 MG capsule Take 20 mg by mouth daily as needed.      phentermine 37.5 MG capsule Take 37.5 mg by mouth every  morning.     pramipexole (MIRAPEX) 0.25 MG tablet Take 0.25 mg by mouth at bedtime. Usually twice a week     No current facility-administered medications for this visit.   Allergies:  No known allergies   Social History: The patient  reports that she quit smoking about 12 years ago. Her smoking use included cigarettes. She started smoking about 27 years ago. She has a 15 pack-year smoking history. She has never used smokeless tobacco. She reports current alcohol use of about 3.0 standard drinks of alcohol per week. She reports that she does not use drugs.   Family History: The patient's family history includes Colon cancer in her maternal grandmother; Crohn's disease in her son; Hyperlipidemia in her mother; Hypertension in her mother; Hypothyroidism in her mother; Osteoporosis in her mother; Stroke in her maternal grandfather.   ROS:  Please see the history of present illness. Otherwise, complete review of systems is positive for none  All other systems are reviewed and negative.   Physical Exam: VS:  BP 114/78   Pulse 89   Ht 5\' 1"  (1.549 m)   Wt 156 lb 12.8 oz (71.1 kg)   LMP 02/01/2012   SpO2 96%   BMI 29.63 kg/m , BMI Body mass index is 29.63 kg/m.  Wt Readings from Last 3 Encounters:  09/12/23 156  lb 12.8 oz (71.1 kg)  01/19/21 201 lb 12.8 oz (91.5 kg)  12/07/20 201 lb 6.4 oz (91.4 kg)    General: Patient appears comfortable at rest. HEENT: Conjunctiva and lids normal, oropharynx clear with moist mucosa. Neck: Supple, no elevated JVP or carotid bruits, no thyromegaly. Lungs: Clear to auscultation, nonlabored breathing at rest. Cardiac: Regular rate and rhythm, no S3 or significant systolic murmur, no pericardial rub. Abdomen: Soft, nontender, no hepatomegaly, bowel sounds present, no guarding or rebound. Extremities: No pitting edema, distal pulses 2+. Skin: Warm and dry. Musculoskeletal: No kyphosis. Neuropsychiatric: Alert and oriented x3, affect grossly  appropriate.  Recent Labwork: No results found for requested labs within last 365 days.     Component Value Date/Time   CHOL 235 (H) 12/07/2020 1452   TRIG 119 12/07/2020 1452   HDL 67 12/07/2020 1452   CHOLHDL 3.5 12/07/2020 1452   LDLCALC 144 (H) 12/07/2020 1452     Assessment and Plan:   Innocent heart murmur: No > Grade 3/6 systolic murmur or diastolic murmur heard.  She will not benefit from echocardiogram.  If she does not have any symptoms of chest pain, DOE, syncope.      Medication Adjustments/Labs and Tests Ordered: Current medicines are reviewed at length with the patient today.  Concerns regarding medicines are outlined above.    Disposition:  Follow up prn  Signed Diyan Dave Verne Spurr, MD, 09/12/2023 3:23 PM    [facility] [address] [place], [place], [place] [address]

## 2023-10-11 ENCOUNTER — Encounter (HOSPITAL_COMMUNITY): Payer: Self-pay

## 2023-10-11 ENCOUNTER — Emergency Department (HOSPITAL_COMMUNITY)
Admission: EM | Admit: 2023-10-11 | Discharge: 2023-10-12 | Discharge status: Against Medical Advice | Attending: Emergency Medicine | Admitting: Emergency Medicine

## 2023-10-11 ENCOUNTER — Other Ambulatory Visit: Payer: Self-pay

## 2023-10-11 DIAGNOSIS — L0201 Cutaneous abscess of face: Secondary | ICD-10-CM | POA: Diagnosis present

## 2023-10-11 DIAGNOSIS — Z5321 Procedure and treatment not carried out due to patient leaving prior to being seen by health care provider: Secondary | ICD-10-CM | POA: Insufficient documentation

## 2023-10-11 NOTE — ED Notes (Signed)
 Patient left.

## 2023-10-11 NOTE — ED Provider Triage Note (Signed)
 Emergency Medicine Provider Triage Evaluation Note  Lindsay Howe , a 63 y.o. female  was evaluated in triage.  Pt complains of abscess.  Patient reports abscess on left cheek for the past 6 days.  Went to urgent care 4 days ago and was started on cefdinir.  Has been taking it since Sunday without improvement.  She tried to use a glass jar to suction out any fluid.  Had some serous fluid but states she has some bruising to the cheek since then.  Reports spread of redness of the cheeks since this happened yesterday.  No fevers.  No drainage.  Review of Systems  Positive: Abscess Negative: Fevers  Physical Exam  BP (!) 146/76   Pulse 86   Temp 98.7 F (37.1 C) (Oral)   Resp 20   Ht 5\' 1"  (1.549 m)   Wt 73.9 kg   LMP 02/01/2012   SpO2 100%   BMI 30.80 kg/m  Gen:   Awake, no distress   Resp:  Normal effort  MSK:   Moves extremities without difficulty  Other:  No swelling of the oropharynx  Medical Decision Making  Medically screening exam initiated at 3:40 PM.  Appropriate orders placed.  Lindsay Howe was informed that the remainder of the evaluation will be completed by another provider, this initial triage assessment does not replace that evaluation, and the importance of remaining in the ED until their evaluation is complete.    Sonnie Dusky, PA-C 10/11/23 1542

## 2023-10-11 NOTE — ED Triage Notes (Signed)
 Pt has abscess on left side of face starting Friday. Seen at [facility] Sunday and started ABX. Since Sunday redness has spread and is not getting better. Denies fevers. No drainage

## 2023-10-25 ENCOUNTER — Other Ambulatory Visit: Payer: Self-pay

## 2023-10-25 ENCOUNTER — Encounter: Payer: Self-pay | Admitting: Emergency Medicine

## 2023-10-25 ENCOUNTER — Ambulatory Visit
Admission: EM | Admit: 2023-10-25 | Discharge: 2023-10-25 | Disposition: A | Discharge status: Home / Self Care | Attending: Nurse Practitioner | Admitting: Nurse Practitioner

## 2023-10-25 ENCOUNTER — Ambulatory Visit (INDEPENDENT_AMBULATORY_CARE_PROVIDER_SITE_OTHER)

## 2023-10-25 DIAGNOSIS — S86912A Strain of unspecified muscle(s) and tendon(s) at lower leg level, left leg, initial encounter: Secondary | ICD-10-CM

## 2023-10-25 NOTE — ED Triage Notes (Signed)
 Pt reports left knee pain since mowing yard yesterday on an incline. Pt able to bear weight but with moderate limp.

## 2023-10-25 NOTE — Discharge Instructions (Signed)
 The x-ray of your knee today is negative for acute abnormality.  I suspect you have strained or sprained a ligament, muscle, or tendon in your knee.  Please wear the knee brace when you are up walking around for the next few days.  When sitting down, keep your knee elevated and apply ice 15 minutes on, 45 minutes off every hour while awake.  Take Tylenol or ibuprofen as needed for pain.  If symptoms are not significantly improved in the next few days, recommend follow-up with orthopedic provider; contact information has been provided.

## 2023-10-25 NOTE — ED Provider Notes (Signed)
 [facility]    CSN: [id number] Arrival date & time: 10/25/23  1017      History   Chief Complaint Chief Complaint  Patient presents with   Knee Pain    HPI Lindsay Howe is a 63 y.o. female.   Patient presents today with 1 day history of left knee pain.  Reports that she was mowing the yard yesterday on an incline when she stepped wrong and twisted her left knee.  Reports since that time, she has had pain diffusely to the left knee including behind the knee.  It is painful to walk on the knee.  She denies swelling, bruising, or redness.  No numbness or tingling in the toes, but pain does occasionally shoot down the leg.    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   Reflux esophagitis 10/15/2012   Hypothyroidism 06/06/2012   GERD (gastroesophageal reflux disease) 06/06/2012   Dysphagia 06/06/2012    Past Surgical History:  Procedure Laterality Date   BLADDER REPAIR     BREAST ENHANCEMENT SURGERY     CARPAL TUNNEL RELEASE     left   COLONOSCOPY WITH ESOPHAGOGASTRODUODENOSCOPY (EGD)  07/05/2012   Procedure: COLONOSCOPY WITH ESOPHAGOGASTRODUODENOSCOPY (EGD);  Surgeon: Ruby Corporal, MD;  Location: [facility];  Service: Endoscopy;  Laterality: N/A;  1200   THYROIDECTOMY, PARTIAL     TUBAL LIGATION      OB History     Gravida  2   Para  2   Term  2   Preterm      AB      Living   2      SAB      IAB      Ectopic      Multiple      Live Births               Home Medications    Prior to Admission medications   Medication Sig Start Date End Date Taking? Authorizing Provider  ALPRAZolam (XANAX) 0.5 MG tablet Take 0.5 mg by mouth at bedtime as needed for sleep.  11/11/15   [provider]  dicyclomine (BENTYL) 10 MG capsule Take 10 mg by mouth daily. 03/09/20   [provider]  escitalopram (LEXAPRO) 20 MG tablet Take 20 mg by mouth daily.    [provider]  levothyroxine (SYNTHROID) 112 MCG tablet Take 112 mcg by mouth daily. 08/18/23   [provider]  NEEDLE, DISP, 25 G 25G X 1" MISC 100 Units by Does not apply route once a week. 12/23/19   Gosrani, Nimish C, MD  omeprazole (PRILOSEC) 20 MG capsule Take 20 mg by mouth daily as needed.     [provider]  phentermine 37.5 MG capsule Take 37.5 mg by mouth every morning.    [provider]  pramipexole (MIRAPEX) 0.25 MG tablet Take  0.25 mg by mouth at bedtime. Usually twice a week    [provider]    Family History Family History  Problem Relation Age of Onset   Hypothyroidism Mother    Hypertension Mother    Hyperlipidemia Mother    Osteoporosis Mother    Colon cancer Maternal Grandmother    Stroke Maternal Grandfather    Crohn's disease Son     Social History Social History   Tobacco Use   Smoking status: Former    Current packs/day: 0.00    Average packs/day: 1 pack/day for 15.0 years (15.0 ttl pk-yrs)    Types: Cigarettes    Start date: 03/27/1996    Quit date: 03/28/2011    Years since quitting: 12.5   Smokeless tobacco: Never  Vaping Use   Vaping status: Never Used  Substance Use Topics   Alcohol use: Yes    Alcohol/week: 3.0 standard drinks of alcohol    Types: 3 Glasses of wine per week   Drug use: No     Allergies   No known allergies   Review of Systems Review of Systems Per HPI  Physical Exam Triage  Vital Signs ED Triage Vitals [10/25/23 1034]  Encounter Vitals Group     BP 135/78     Systolic BP Percentile      Diastolic BP Percentile      Pulse Rate 90     Resp 20     Temp 97.8 F (36.6 C)     Temp Source Oral     SpO2 95 %     Weight      Height      Head Circumference      Peak Flow      Pain Score 0     Pain Loc      Pain Education      Exclude from Growth Chart    No data found.  Updated Vital Signs BP 135/78 (BP Location: Right Arm)   Pulse 90   Temp 97.8 F (36.6 C) (Oral)   Resp 20   LMP 02/01/2012   SpO2 95%   Visual Acuity Right Eye Distance:   Left Eye Distance:   Bilateral Distance:    Right Eye Near:   Left Eye Near:    Bilateral Near:     Physical Exam Vitals and nursing note reviewed.  Constitutional:      General: She is not in acute distress.    Appearance: Normal appearance. She is not toxic-appearing.  HENT:     Mouth/Throat:     Mouth: Mucous membranes are moist.     Pharynx: Oropharynx is clear.  Pulmonary:     Effort: Pulmonary effort is normal. No respiratory distress.  Musculoskeletal:     Comments: Inspection: no swelling, bruising, obvious deformity or redness to left knee Palpation: tender to palpation left knee lateral and medial joint line diffusely; popliteal fossa diffusely; no obvious deformities palpated ROM: Patient has pain with flexion and full extension; difficult to assess range of motion; no laxity appreciated Strength: 5/5 bilateral lower extremities Neurovascular: neurovascularly intact in distal bilateral lower extremities  Skin:    General: Skin is warm and dry.     Capillary Refill: Capillary refill takes less than 2 seconds.     Coloration: Skin is not jaundiced or pale.     Findings: No erythema.  Neurological:     Mental Status: She is alert and oriented to person, place, and time.  Psychiatric:  Behavior: Behavior is cooperative.      UC Treatments / Results  Labs (all labs ordered are  listed, but only abnormal results are displayed) Labs Reviewed - No data to display  EKG   Radiology DG Knee Complete 4 Views Left Result Date: 10/25/2023 CLINICAL DATA:  Left knee pain for 2 days. EXAM: LEFT KNEE - COMPLETE 4 VIEW COMPARISON:  None Available. FINDINGS: No fracture or dislocation. Preserved joint spaces and bone mineralization. No joint effusion on lateral view. IMPRESSION: No acute osseous abnormality. Electronically Signed   By: Adrianna Horde M.D.   On: 10/25/2023 11:06    Procedures Procedures (including critical care time)  Medications Ordered in UC Medications - No data to display  Initial Impression / Assessment and Plan / UC Course  I have reviewed the triage vital signs and the nursing notes.  Pertinent labs & imaging results that were available during my care of the patient were reviewed by me and considered in my medical decision making (see chart for details).   Patient is well-appearing, normotensive, afebrile, not tachycardic, not tachypneic, oxygenating well on room air.    1.  Knee strain, left, initial encounter X-ray imaging today is negative for acute bony abnormality Suspect knee strain  Treat with knee brace, rest, elevation, ice, Tylenol/ibuprofen as needed for pain Recommended follow-up with orthopedic provider if symptoms do not improve with treatment  The patient was given the opportunity to ask questions.  All questions answered to their satisfaction.  The patient is in agreement to this plan.   Final Clinical Impressions(s) / UC Diagnoses   Final diagnoses:  Knee strain, left, initial encounter     Discharge Instructions      The x-ray of your knee today is negative for acute abnormality.  I suspect you have strained or sprained a ligament, muscle, or tendon in your knee.  Please wear the knee brace when you are up walking around for the next few days.  When sitting down, keep your knee elevated and apply ice 15 minutes on, 45 minutes  off every hour while awake.  Take Tylenol or ibuprofen as needed for pain.  If symptoms are not significantly improved in the next few days, recommend follow-up with orthopedic provider; contact information has been provided.    ED Prescriptions   None    PDMP not reviewed this encounter.   Wilhemena Harbour, NP 10/25/23 [phone]

## 2024-05-27 ENCOUNTER — Encounter: Payer: Self-pay | Admitting: Obstetrics & Gynecology

## 2024-05-27 ENCOUNTER — Ambulatory Visit: Admitting: Obstetrics & Gynecology

## 2024-05-27 VITALS — BP 139/89 | HR 82 | Ht 61.0 in | Wt 177.0 lb

## 2024-05-27 DIAGNOSIS — Z1231 Encounter for screening mammogram for malignant neoplasm of breast: Secondary | ICD-10-CM

## 2024-05-27 DIAGNOSIS — Z01419 Encounter for gynecological examination (general) (routine) without abnormal findings: Secondary | ICD-10-CM | POA: Diagnosis not present

## 2024-05-27 DIAGNOSIS — R159 Full incontinence of feces: Secondary | ICD-10-CM | POA: Diagnosis not present

## 2024-05-27 NOTE — Patient Instructions (Signed)
Please schedule a mammogram at one of the following locations:  [facility]: [phone]

## 2024-05-27 NOTE — Progress Notes (Signed)
 WELL-WOMAN EXAMINATION Patient name: Lindsay Howe MRN [id number]  Date of birth: May 14, 1961 Chief Complaint:   Gynecologic Exam  History of Present Illness:   Lindsay Howe is a 63 y.o. [phone] PM female being seen today for a routine well-woman exam.   Transitioning from physician to women's to our office.  Last visit in their office was December 2024.  Some records reviewed in care everywhere  On occasion night sweats- but manageable with herbal supplements  Notes fecal incontinence- typically weekly- both stool and flatulence.  This recently started in the past few mos.  Denies urinary incontinence.  Patient's last menstrual period was 02/01/2012.  The current method of family planning is post menopausal status.    Last pap outside facility ~ 10yrs ago- plan to collect today.  Last mammogram: 05/2023. Last colonoscopy: 2014     05/27/2024    2:11 PM 12/07/2020    2:30 PM 07/07/2020    2:42 PM 09/17/2019   11:21 AM  Depression screen PHQ 2/9  Decreased Interest 0 0 2 0  Down, Depressed, Hopeless 0 1 3 0  PHQ - 2 Score 0 1 5 0  Altered sleeping 2 1 3    Tired, decreased energy 2 0 3   Change in appetite 3 0 1   Feeling bad or failure about yourself  0 0 0   Trouble concentrating 0 1 2   Moving slowly or fidgety/restless 0 0 0   Suicidal thoughts 0 0 0   PHQ-9 Score 7 3  14     Difficult doing work/chores  Not difficult at all Somewhat difficult      Data saved with a previous flowsheet row definition      Review of Systems:   Pertinent items are noted in HPI Denies any headaches, blurred vision, fatigue, shortness of breath, chest pain, abdominal pain, bowel movements, urination, or intercourse unless otherwise stated above.  Pertinent History Reviewed:  Reviewed past medical,surgical, social and family history.  Reviewed problem list, medications and allergies. Physical Assessment:   Vitals:   05/27/24 1411  BP: 139/89  Pulse: 82  Weight: 177 lb (80.3  kg)  Height: 5' 1 (1.549 m)  Body mass index is 33.44 kg/m.        Physical Examination:   General appearance - well appearing, and in no distress  Mental status - alert, oriented to person, place, and time  Psych:  She has a normal mood and affect  Skin - warm and dry, normal color, no suspicious lesions noted  Chest - effort normal, all lung fields clear to auscultation bilaterally  Heart - normal rate and regular rhythm  Neck:  midline trachea, no thyromegaly or nodules  Breasts - breasts appear normal, no suspicious masses, no skin or nipple changes or  axillary nodes  Abdomen - soft, nontender, nondistended, no masses or organomegaly  Pelvic - VULVA: normal appearing vulva with no masses, tenderness or lesions  VAGINA: normal appearing vagina with normal color and discharge, no lesions.  Short perineal body.  CERVIX: normal appearing cervix without discharge or lesions, no CMT  UTERUS: uterus is felt to be normal size, shape, consistency and nontender   ADNEXA: No adnexal masses or tenderness noted.  Rectal - no hemorrhoids noted, normal tone noted  Extremities:  No swelling or varicosities noted  Chaperone: Alan Fischer     Assessment & Plan:  1) Well-Woman Exam -pap up to date -mammogram ordered  2) Fecal incontinence - Reviewed conservative  options including bulking agents - Referral to urogyn  Orders Placed This Encounter  Procedures   MM 3D SCREENING MAMMOGRAM BILATERAL BREAST   HM PAP SMEAR   Ambulatory referral to Urogynecology    Meds: No orders of the defined types were placed in this encounter.   Follow-up: Return in about 1 year (around 05/27/2025) for Annual, please provide # for [facility] mammogram.   Mills Mitton, DO Attending Obstetrician & Gynecologist, [facility], [facility]
